# Patient Record
Sex: Male | Born: 1975 | Race: Black or African American | Hispanic: No | Marital: Married | Smoking: Never smoker
Health system: Southern US, Community
[De-identification: ages and names within clinical notes are randomized; demographics above are authoritative.]

## PROBLEM LIST (undated history)

## (undated) DIAGNOSIS — F32A Depression, unspecified: Secondary | ICD-10-CM

## (undated) DIAGNOSIS — I10 Essential (primary) hypertension: Secondary | ICD-10-CM

## (undated) DIAGNOSIS — F329 Major depressive disorder, single episode, unspecified: Secondary | ICD-10-CM

## (undated) HISTORY — PX: KNEE ARTHROSCOPY: SHX127

## (undated) HISTORY — PX: CHOLECYSTECTOMY: SHX55

## (undated) HISTORY — PX: OTHER SURGICAL HISTORY: SHX169

## (undated) HISTORY — PX: CORNEAL TRANSPLANT: SHX108

---
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
## 2014-05-10 ENCOUNTER — Emergency Department
Admit: 2014-05-10 | Disposition: A | Discharge status: Home / Self Care | Payer: Self-pay | Admitting: Emergency Medicine

## 2014-05-10 LAB — CBC WITH DIFFERENTIAL/PLATELET
BASOS ABS: 0 10*3/uL (ref 0.0–0.1)
BASOS PCT: 0.5 %
Eosinophil #: 0.1 10*3/uL (ref 0.0–0.7)
Eosinophil %: 1.3 %
HCT: 38.7 % — ABNORMAL LOW (ref 40.0–52.0)
HGB: 13 g/dL (ref 13.0–18.0)
Lymphocyte #: 2.9 10*3/uL (ref 1.0–3.6)
Lymphocyte %: 51.4 %
MCH: 29.5 pg (ref 26.0–34.0)
MCHC: 33.5 g/dL (ref 32.0–36.0)
MCV: 88 fL (ref 80–100)
Monocyte #: 0.5 x10 3/mm (ref 0.2–1.0)
Monocyte %: 8.6 %
NEUTROS PCT: 38.2 %
Neutrophil #: 2.1 10*3/uL (ref 1.4–6.5)
Platelet: 340 10*3/uL (ref 150–440)
RBC: 4.39 10*6/uL — ABNORMAL LOW (ref 4.40–5.90)
RDW: 13.9 % (ref 11.5–14.5)
WBC: 5.5 10*3/uL (ref 3.8–10.6)

## 2014-05-10 LAB — BASIC METABOLIC PANEL
ANION GAP: 6 — AB (ref 7–16)
BUN: 16 mg/dL
CALCIUM: 8.9 mg/dL
CHLORIDE: 108 mmol/L
Co2: 28 mmol/L
Creatinine: 1.29 mg/dL — ABNORMAL HIGH
EGFR (African American): >60
EGFR (Non-African Amer.): >60
Glucose: 102 mg/dL — ABNORMAL HIGH
POTASSIUM: 3.9 mmol/L
Sodium: 142 mmol/L

## 2014-05-10 LAB — URINALYSIS, COMPLETE
BACTERIA: NONE SEEN
Bilirubin,UR: NEGATIVE
Blood: NEGATIVE
GLUCOSE, UR: NEGATIVE mg/dL (ref 0–75)
Ketone: NEGATIVE
Leukocyte Esterase: NEGATIVE
NITRITE: NEGATIVE
Ph: 7 (ref 4.5–8.0)
Protein: NEGATIVE
SPECIFIC GRAVITY: 1.012 (ref 1.003–1.030)
Squamous Epithelial: NONE SEEN

## 2014-05-10 LAB — TROPONIN I: Troponin-I: <0.03 ng/mL

## 2015-04-15 ENCOUNTER — Encounter: Payer: Self-pay | Admitting: *Deleted

## 2015-04-15 ENCOUNTER — Ambulatory Visit
Admission: EM | Admit: 2015-04-15 | Discharge: 2015-04-15 | Disposition: A | Discharge status: Home / Self Care | Payer: 59 | Attending: Family Medicine | Admitting: Family Medicine

## 2015-04-15 DIAGNOSIS — S161XXA Strain of muscle, fascia and tendon at neck level, initial encounter: Secondary | ICD-10-CM

## 2015-04-15 MED ORDER — NAPROXEN 500 MG PO TABS: 500.0000 mg | ORAL_TABLET | Freq: Two times a day (BID) | ORAL | Status: DC

## 2015-04-15 MED ORDER — METAXALONE 800 MG PO TABS: 800.0000 mg | ORAL_TABLET | Freq: Three times a day (TID) | ORAL | Status: DC
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
## 2015-04-15 NOTE — Discharge Instructions (Signed)
Cervical Sprain  A cervical sprain is an injury in the neck in which the strong, fibrous tissues (ligaments) that connect your neck bones stretch or tear. Cervical sprains can range from mild to severe. Severe cervical sprains can cause the neck vertebrae to be unstable. This can lead to damage of the spinal cord and can result in serious nervous system problems. The amount of time it takes for a cervical sprain to get better depends on the cause and extent of the injury. Most cervical sprains heal in 1 to 3 weeks.  CAUSES   Severe cervical sprains may be caused by:    Contact sport injuries (such as from football, rugby, wrestling, hockey, auto racing, gymnastics, diving, martial arts, or boxing).    Motor vehicle collisions.    Whiplash injuries. This is an injury from a sudden forward and backward whipping movement of the head and neck.   Falls.   Mild cervical sprains may be caused by:    Being in an awkward position, such as while cradling a telephone between your ear and shoulder.    Sitting in a chair that does not offer proper support.    Working at a poorly designed computer station.    Looking up or down for long periods of time.   SYMPTOMS    Pain, soreness, stiffness, or a burning sensation in the front, back, or sides of the neck. This discomfort may develop immediately after the injury or slowly, 24 hours or more after the injury.    Pain or tenderness directly in the middle of the back of the neck.    Shoulder or upper back pain.    Limited ability to move the neck.    Headache.    Dizziness.    Weakness, numbness, or tingling in the hands or arms.    Muscle spasms.    Difficulty swallowing or chewing.    Tenderness and swelling of the neck.   DIAGNOSIS   Most of the time your health care provider can diagnose a cervical sprain by taking your history and doing a physical exam. Your health care provider will ask about previous neck injuries and any known neck  problems, such as arthritis in the neck. X-rays may be taken to find out if there are any other problems, such as with the bones of the neck. Other tests, such as a CT scan or MRI, may also be needed.   TREATMENT   Treatment depends on the severity of the cervical sprain. Mild sprains can be treated with rest, keeping the neck in place (immobilization), and pain medicines. Severe cervical sprains are immediately immobilized. Further treatment is done to help with pain, muscle spasms, and other symptoms and may include:   Medicines, such as pain relievers, numbing medicines, or muscle relaxants.    Physical therapy. This may involve stretching exercises, strengthening exercises, and posture training. Exercises and improved posture can help stabilize the neck, strengthen muscles, and help stop symptoms from returning.   HOME CARE INSTRUCTIONS    Put ice on the injured area.     Put ice in a plastic bag.     Place a towel between your skin and the bag.     Leave the ice on for 15-20 minutes, 3-4 times a day.    If your injury was severe, you may have been given a cervical collar to wear. A cervical collar is a two-piece collar designed to keep your neck from moving while it heals.      Do not remove the collar unless instructed by your health care provider.    If you have long hair, keep it outside of the collar.    Ask your health care provider before making any adjustments to your collar. Minor adjustments may be required over time to improve comfort and reduce pressure on your chin or on the back of your head.    Ifyou are allowed to remove the collar for cleaning or bathing, follow your health care provider's instructions on how to do so safely.    Keep your collar clean by wiping it with mild soap and water and drying it completely. If the collar you have been given includes removable pads, remove them every 1-2 days and hand wash them with soap and water. Allow them to air dry. They should be completely  dry before you wear them in the collar.    If you are allowed to remove the collar for cleaning and bathing, wash and dry the skin of your neck. Check your skin for irritation or sores. If you see any, tell your health care provider.    Do not drive while wearing the collar.    Only take over-the-counter or prescription medicines for pain, discomfort, or fever as directed by your health care provider.    Keep all follow-up appointments as directed by your health care provider.    Keep all physical therapy appointments as directed by your health care provider.    Make any needed adjustments to your workstation to promote good posture.    Avoid positions and activities that make your symptoms worse.    Warm up and stretch before being active to help prevent problems.   SEEK MEDICAL CARE IF:    Your pain is not controlled with medicine.    You are unable to decrease your pain medicine over time as planned.    Your activity level is not improving as expected.   SEEK IMMEDIATE MEDICAL CARE IF:    You develop any bleeding.   You develop stomach upset.   You have signs of an allergic reaction to your medicine.    Your symptoms get worse.    You develop new, unexplained symptoms.    You have numbness, tingling, weakness, or paralysis in any part of your body.   MAKE SURE YOU:    Understand these instructions.   Will watch your condition.   Will get help right away if you are not doing well or get worse.     This information is not intended to replace advice given to you by your health care provider. Make sure you discuss any questions you have with your health care provider.     Document Released: 11/01/2006 Document Revised: 01/09/2013 Document Reviewed: 07/12/2012  Elsevier Interactive Patient Education 2016 [employer]
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
## 2016-05-26 ENCOUNTER — Other Ambulatory Visit: Payer: Self-pay | Admitting: Orthopedic Surgery

## 2016-05-26 DIAGNOSIS — M25561 Pain in right knee: Secondary | ICD-10-CM

## 2016-05-26 DIAGNOSIS — M2391 Unspecified internal derangement of right knee: Secondary | ICD-10-CM

## 2016-05-26 DIAGNOSIS — M2351 Chronic instability of knee, right knee: Secondary | ICD-10-CM

## 2016-06-01 ENCOUNTER — Ambulatory Visit
Admission: RE | Admit: 2016-06-01 | Discharge: 2016-06-01 | Disposition: A | Discharge status: Home / Self Care | Payer: 59 | Source: Ambulatory Visit | Attending: Orthopedic Surgery | Admitting: Orthopedic Surgery

## 2016-06-01 DIAGNOSIS — M25561 Pain in right knee: Secondary | ICD-10-CM | POA: Diagnosis present

## 2016-06-01 DIAGNOSIS — S80221A Blister (nonthermal), right knee, initial encounter: Secondary | ICD-10-CM | POA: Diagnosis not present

## 2016-06-01 DIAGNOSIS — M2351 Chronic instability of knee, right knee: Secondary | ICD-10-CM

## 2016-06-01 DIAGNOSIS — R609 Edema, unspecified: Secondary | ICD-10-CM | POA: Diagnosis not present

## 2016-06-01 DIAGNOSIS — M2391 Unspecified internal derangement of right knee: Secondary | ICD-10-CM

## 2016-06-01 DIAGNOSIS — X58XXXA Exposure to other specified factors, initial encounter: Secondary | ICD-10-CM | POA: Diagnosis not present

## 2016-06-15 DIAGNOSIS — E669 Obesity, unspecified: Secondary | ICD-10-CM | POA: Insufficient documentation

## 2016-11-02 ENCOUNTER — Ambulatory Visit (INDEPENDENT_AMBULATORY_CARE_PROVIDER_SITE_OTHER): Payer: 59

## 2016-11-02 ENCOUNTER — Ambulatory Visit (INDEPENDENT_AMBULATORY_CARE_PROVIDER_SITE_OTHER): Payer: 59 | Admitting: Podiatry

## 2016-11-02 DIAGNOSIS — I10 Essential (primary) hypertension: Secondary | ICD-10-CM | POA: Insufficient documentation

## 2016-11-02 DIAGNOSIS — F32A Depression, unspecified: Secondary | ICD-10-CM | POA: Insufficient documentation

## 2016-11-02 DIAGNOSIS — F329 Major depressive disorder, single episode, unspecified: Secondary | ICD-10-CM | POA: Insufficient documentation

## 2016-11-02 DIAGNOSIS — M722 Plantar fascial fibromatosis: Secondary | ICD-10-CM

## 2016-11-04 NOTE — Progress Notes (Signed)
   Subjective: Patient presents today for intermittent pain and tenderness in the bilateral plantar heels that has been ongoing for the past 4-5 years. Patient states that it hurts in the morning with the first steps out of bed. He states working also increases the pain. He has been seen at this clinic in the past and was diagnosed with plantar fascitis. He has received injections in the past that have provided relief. He is also requesting orthotics. Patient presents today for further treatment and evaluation.  No past medical history on file.   Objective: Physical Exam General: The patient is alert and oriented x3 in no acute distress.  Dermatology: Skin is warm, dry and supple bilateral lower extremities. Negative for open lesions or macerations bilateral.   Vascular: Dorsalis Pedis and Posterior Tibial pulses palpable bilateral.  Capillary fill time is immediate to all digits.  Neurological: Epicritic and protective threshold intact bilateral.   Musculoskeletal: Tenderness to palpation at the medial calcaneal tubercale and through the insertion of the plantar fascia of the bilateral feet. All other joints range of motion within normal limits bilateral. Strength 5/5 in all groups bilateral.   Radiographic exam: Normal osseous mineralization. Joint spaces preserved. No fracture/dislocation/boney destruction. Calcaneal spur present with mild thickening of plantar fascia bilateral. No other soft tissue abnormalities or radiopaque foreign bodies.   Assessment: 1. plantar fasciitis bilateral feet  Plan of Care:  1. Patient evaluated. Xrays reviewed.   2. Injection of 0.5cc Celestone soluspan injected into the bilateral heels.  3. Prescription for Duexis given to patient. 4. Appointment with [facility]ick for custom molded orthotics. 5. Return to clinic when necessary.    Felecia ShellingBrent M. Alexi Dorminey, DPM [facility]  Dr. Felecia ShellingBrent M. Magic Mohler, DPM    [address]                                    [place], [place] [address]                Office [phone]  Fax [phone]) [phone]

## 2016-11-11 MED ORDER — BETAMETHASONE SOD PHOS & ACET 6 (3-3) MG/ML IJ SUSP: 3.0000 mg | Freq: Once | INTRAMUSCULAR | Status: DC

## 2016-11-24 ENCOUNTER — Ambulatory Visit (INDEPENDENT_AMBULATORY_CARE_PROVIDER_SITE_OTHER): Payer: 59 | Admitting: Orthotics

## 2016-11-24 DIAGNOSIS — M722 Plantar fascial fibromatosis: Secondary | ICD-10-CM

## 2016-11-24 NOTE — Progress Notes (Signed)

## 2016-12-03 ENCOUNTER — Encounter: Payer: Self-pay | Admitting: Podiatry

## 2016-12-03 ENCOUNTER — Ambulatory Visit (INDEPENDENT_AMBULATORY_CARE_PROVIDER_SITE_OTHER): Payer: 59 | Admitting: Podiatry

## 2016-12-03 DIAGNOSIS — M722 Plantar fascial fibromatosis: Secondary | ICD-10-CM | POA: Diagnosis not present

## 2016-12-03 MED ORDER — MELOXICAM 15 MG PO TABS: 15.0000 mg | ORAL_TABLET | Freq: Every day | ORAL | 1 refills | Status: DC

## 2016-12-03 MED ORDER — METHYLPREDNISOLONE 4 MG PO TABS: 4.0000 mg | ORAL_TABLET | Freq: Every day | ORAL | 0 refills | Status: DC

## 2016-12-06 NOTE — Progress Notes (Signed)
   Subjective: Patient presents today for follow up evaluation of bilateral plantar fasciitis. He states the right heel is worse than the left but reports significant pain in both. He rates the pain at 10/10. He reports some relief of the symptoms after receiving the injection. There are no modifying factors noted. Patient presents today for further treatment and evaluation.   No past medical history on file.   Objective: Physical Exam General: The patient is alert and oriented x3 in no acute distress.  Dermatology: Skin is warm, dry and supple bilateral lower extremities. Negative for open lesions or macerations bilateral.   Vascular: Dorsalis Pedis and Posterior Tibial pulses palpable bilateral.  Capillary fill time is immediate to all digits.  Neurological: Epicritic and protective threshold intact bilateral.   Musculoskeletal: Tenderness to palpation at the medial calcaneal tubercale and through the insertion of the plantar fascia of the bilateral feet. All other joints range of motion within normal limits bilateral. Strength 5/5 in all groups bilateral.   Assessment: 1. plantar fasciitis bilateral feet  Plan of Care:  1. Patient evaluated.  2. Injection of 0.5cc Celestone soluspan injected into the bilateral heels.  3. Prescription for Medrol Dose Pak ordered for patient. 4. Prescription for Meloxicam ordered for patient. Discontinue taking Duexis due to stomach intolerance.  5. Scheduled to pick up orthotics in 3 weeks. 6. Return to clinic when necessary.    Felecia ShellingBrent M. Wiatt Mahabir, DPM [facility]  Dr. Felecia ShellingBrent M. Dalynn Jhaveri, DPM    [address]                                   [place], [place] [address]                Office [phone]  Fax [phone]

## 2016-12-21 ENCOUNTER — Ambulatory Visit: Payer: 59 | Admitting: Orthotics

## 2016-12-21 DIAGNOSIS — M722 Plantar fascial fibromatosis: Secondary | ICD-10-CM

## 2016-12-21 NOTE — Progress Notes (Signed)
Patient came in today to pick up custom made foot orthotics.  The goals were accomplished and the patient reported no dissatisfaction with said orthotics.  Patient was advised of breakin period and how to report any issues. 

## 2017-02-10 ENCOUNTER — Other Ambulatory Visit: Payer: Self-pay

## 2017-02-10 MED ORDER — MELOXICAM 15 MG PO TABS: 15.0000 mg | ORAL_TABLET | Freq: Every day | ORAL | 1 refills | Status: DC

## 2017-02-10 NOTE — Telephone Encounter (Signed)
Pharmacy request 90 day refill for Meloxicam.  Per Dr. Logan BoresEvans, ok to refill.  Script has been sent to pharmacy

## 2017-03-06 ENCOUNTER — Emergency Department
Admission: EM | Admit: 2017-03-06 | Discharge: 2017-03-06 | Disposition: A | Discharge status: Home / Self Care | Payer: 59 | Attending: Emergency Medicine | Admitting: Emergency Medicine

## 2017-03-06 ENCOUNTER — Other Ambulatory Visit: Payer: Self-pay

## 2017-03-06 ENCOUNTER — Encounter: Payer: Self-pay | Admitting: Emergency Medicine

## 2017-03-06 DIAGNOSIS — Z79899 Other long term (current) drug therapy: Secondary | ICD-10-CM | POA: Diagnosis not present

## 2017-03-06 DIAGNOSIS — I10 Essential (primary) hypertension: Secondary | ICD-10-CM | POA: Diagnosis not present

## 2017-03-06 DIAGNOSIS — R05 Cough: Secondary | ICD-10-CM | POA: Diagnosis present

## 2017-03-06 DIAGNOSIS — J069 Acute upper respiratory infection, unspecified: Secondary | ICD-10-CM | POA: Insufficient documentation

## 2017-03-06 DIAGNOSIS — B9789 Other viral agents as the cause of diseases classified elsewhere: Secondary | ICD-10-CM | POA: Diagnosis not present

## 2017-03-06 HISTORY — DX: Depression, unspecified: F32.A

## 2017-03-06 HISTORY — DX: Essential (primary) hypertension: I10

## 2017-03-06 HISTORY — DX: Major depressive disorder, single episode, unspecified: F32.9

## 2017-03-06 LAB — INFLUENZA PANEL BY PCR (TYPE A & B)
Influenza A By PCR: NEGATIVE
Influenza B By PCR: NEGATIVE

## 2017-03-06 LAB — GROUP A STREP BY PCR: Group A Strep by PCR: NOT DETECTED

## 2017-03-06 MED ORDER — GUAIFENESIN-CODEINE 100-10 MG/5ML PO SOLN: 5.0000 mL | Freq: Four times a day (QID) | ORAL | 0 refills | Status: DC | PRN

## 2017-03-06 NOTE — ED Notes (Signed)
See triage note  Presents with left ear pain,cough and headache which started on Friday  Denies any fever afebrile on arrival   Cough is non prod

## 2017-03-06 NOTE — ED Provider Notes (Signed)
[facility] Emergency Department Provider Note  ____________________________________________   First MD Initiated Contact with Patient 03/06/17 0720     (approximate)  I have reviewed the triage vital signs and the nursing notes.   HISTORY  Chief Complaint Cough; Headache; Otalgia; and Sore Throat   HPI Darren Perry is a 42 y.o. male is here with complaint of cough, sore throat, left ear pain for the last 2 days.  Patient also states he woke this morning with a headache.  He denies any nausea, vomiting or diarrhea.  Patient states he has tried some over-the-counter medication without any improvement.  He rates his pain is not over 10.   Past Medical History:  Diagnosis Date  . Depression   . Hypertension     Patient Active Problem List   Diagnosis Date Noted  . Depression 11/02/2016  . Hypertension 11/02/2016  . Obesity (BMI 30.0-34.9) 06/15/2016    Past Surgical History:  Procedure Laterality Date  . CORNEAL TRANSPLANT    . KNEE ARTHROSCOPY      Prior to Admission medications   Medication Sig Start Date End Date Taking? Authorizing Provider  hydrochlorothiazide (HYDRODIURIL) 25 MG tablet Take by mouth. 01/07/16 03/06/17 Yes [provider]  sertraline (ZOLOFT) 50 MG tablet Take 50 mg by mouth daily.   Yes [provider]  guaiFENesin-codeine 100-10 MG/5ML syrup Take 5 mLs by mouth every 6 (six) hours as needed for cough. 03/06/17   Tommi RumpsSummers, Marcy Bogosian L, PA-C    Allergies Patient has no known allergies.  Family History  Problem Relation Age of Onset  . Hypertension Mother   . Diabetes Mother   . Cancer Mother   . Hypertension Father     Social History Social History   Tobacco Use  . Smoking status: Never Smoker  . Smokeless tobacco: Never Used  Substance Use Topics  . Alcohol use: No  . Drug use: No    Review of Systems Constitutional: No fever/chills Eyes: No visual changes. ENT: Positive for sore throat  and left ear pain. Cardiovascular: Denies chest pain. Respiratory: Denies shortness of breath.  Positive for cough. Gastrointestinal: No abdominal pain.  No nausea, no vomiting.  No diarrhea.  Musculoskeletal: Positive for body aches. Skin: Negative for rash. Neurological: Positive for headaches, no focal weakness or numbness. ____________________________________________   PHYSICAL EXAM:  VITAL SIGNS: ED Triage Vitals  Enc Vitals Group     BP 03/06/17 0527 (!) 138/94     Pulse Rate 03/06/17 0527 84     Resp 03/06/17 0527 17     Temp 03/06/17 0527 97.7 F (36.5 C)     Temp Source 03/06/17 0527 Oral     SpO2 03/06/17 0527 100 %     Weight 03/06/17 0528 220 lb (99.8 kg)     Height 03/06/17 0528 5\' 8"  (1.727 m)     Head Circumference --      Peak Flow --      Pain Score 03/06/17 0527 9     Pain Loc --      Pain Edu? --      Excl. in GC? --    Constitutional: Alert and oriented. Well appearing and in no acute distress. Eyes: Conjunctivae are normal.  Head: Atraumatic. Nose: Mild congestion/rhinnorhea.  TMs are dull bilaterally without erythema or injection. Mouth/Throat: Mucous membranes are moist.  Oropharynx non-erythematous.  Posterior drainage noted. Neck: No stridor.   Hematological/Lymphatic/Immunilogical: No cervical lymphadenopathy. Cardiovascular: Normal rate, regular rhythm. Grossly normal  heart sounds.  Good peripheral circulation. Respiratory: Normal respiratory effort.  No retractions. Lungs CTAB. Gastrointestinal: Soft and nontender. No distention.  Musculoskeletal: Moves upper and lower extremities without any difficulty.  Normal gait was noted. Neurologic:  Normal speech and language. No gross focal neurologic deficits are appreciated.  Skin:  Skin is warm, dry and intact. No rash noted. Psychiatric: Mood and affect are normal. Speech and behavior are normal.  ____________________________________________   LABS (all labs ordered are listed, but only  abnormal results are displayed)  Labs Reviewed  GROUP A STREP BY PCR  INFLUENZA PANEL BY PCR (TYPE A & B)    PROCEDURES  Procedure(s) performed: None  Procedures  Critical Care performed: No  ____________________________________________   INITIAL IMPRESSION / ASSESSMENT AND PLAN / ED COURSE Patient was given prescription for Robitussin-AC to take as needed for cough and congestion.  She is to continue taking Tylenol or ibuprofen if needed for fever or body aches.  He is encouraged to increase fluids.  And follow-up with [facility] primary if any continued problems.  ____________________________________________   FINAL CLINICAL IMPRESSION(S) / ED DIAGNOSES  Final diagnoses:  Viral URI with cough     ED Discharge Orders        Ordered    guaiFENesin-codeine 100-10 MG/5ML syrup  Every 6 hours PRN     03/06/17 0727       Note:  This document was prepared using Dragon voice recognition software and may include unintentional dictation errors.    Tommi Rumps, PA-C 03/06/17 1401    Arnaldo Natal, MD 03/06/17 [phone]

## 2017-03-06 NOTE — ED Triage Notes (Signed)
Pt reports cough, sore throat and left earache since Friday; woke this am with headache to top right side of his head; headache has eased some since taking Alleve;

## 2017-03-06 NOTE — Discharge Instructions (Signed)
Follow-up with your primary care provider if any continued problems.  Increase fluids.  Tylenol or ibuprofen as needed for headache or body aches.  Robitussin-AC contains codeine which is a narcotic.  Do not take this medication and drive or operate machinery.

## 2017-04-12 ENCOUNTER — Ambulatory Visit (INDEPENDENT_AMBULATORY_CARE_PROVIDER_SITE_OTHER): Payer: 59 | Admitting: Podiatry

## 2017-04-12 ENCOUNTER — Encounter: Payer: Self-pay | Admitting: Podiatry

## 2017-04-12 ENCOUNTER — Other Ambulatory Visit: Payer: Self-pay | Admitting: Podiatry

## 2017-04-12 ENCOUNTER — Ambulatory Visit (INDEPENDENT_AMBULATORY_CARE_PROVIDER_SITE_OTHER): Payer: 59

## 2017-04-12 DIAGNOSIS — M659 Synovitis and tenosynovitis, unspecified: Secondary | ICD-10-CM

## 2017-04-12 DIAGNOSIS — M722 Plantar fascial fibromatosis: Secondary | ICD-10-CM

## 2017-04-12 DIAGNOSIS — M779 Enthesopathy, unspecified: Secondary | ICD-10-CM

## 2017-04-12 MED ORDER — MELOXICAM 15 MG PO TABS: 15.0000 mg | ORAL_TABLET | Freq: Every day | ORAL | 3 refills | Status: DC

## 2017-04-14 NOTE — Progress Notes (Signed)
   Subjective: 42 year old male presents today for follow up evaluation of bilateral plantar fasciitis that began a few weeks ago. He states that injections and wearing his orthotics have helped alleviate the pain in the past. Walking and bearing weight for long periods of time increases the pain.  He also reports pain to the medial aspect of the left ankle that began two weeks ago. He has not done anything to treat the pain. Walking and bearing weight increases this pain as well. He denies any injury. Patient is here for further evaluation and treatment.   Past Medical History:  Diagnosis Date  . Depression   . Hypertension      Objective: Physical Exam General: The patient is alert and oriented x3 in no acute distress.  Dermatology: Skin is warm, dry and supple bilateral lower extremities. Negative for open lesions or macerations bilateral.   Vascular: Dorsalis Pedis and Posterior Tibial pulses palpable bilateral.  Capillary fill time is immediate to all digits.  Neurological: Epicritic and protective threshold intact bilateral.   Musculoskeletal: Tenderness to palpation to the plantar aspect of the bilateral heels along the plantar fascia. Pain with palpation noted to the medial aspect of the patient's left ankle. All other joints range of motion within normal limits bilateral. Strength 5/5 in all groups bilateral.   Radiographic exam: Normal osseous mineralization. Joint spaces preserved. No fracture/dislocation/boney destruction. No other soft tissue abnormalities or radiopaque foreign bodies.   Assessment: 1. plantar fasciitis bilateral feet 2. Synovitis left ankle  Plan of Care:  1. Patient evaluated. Xrays reviewed.   2. Injection of 0.5cc Celestone soluspan injected into the bilateral heels.  3. Injection of 0.5 mLs Celestone Soluspan injected into the left ankle joint.  4. Refill prescription for Mobic 15 mg provided to patient.  5. Ankle brace dispensed.  6. Return to  clinic in 4 weeks.    Felecia ShellingBrent M. Evans, DPM [facility]  Dr. Felecia ShellingBrent M. Evans, DPM    [address]                                   [place], [place] [address]                Office [phone]  Fax [phone]) [phone]

## 2017-09-13 ENCOUNTER — Encounter: Payer: Self-pay | Admitting: Podiatry

## 2017-09-13 ENCOUNTER — Ambulatory Visit (INDEPENDENT_AMBULATORY_CARE_PROVIDER_SITE_OTHER): Payer: 59 | Admitting: Podiatry

## 2017-09-13 DIAGNOSIS — M76822 Posterior tibial tendinitis, left leg: Secondary | ICD-10-CM | POA: Diagnosis not present

## 2017-09-13 DIAGNOSIS — M722 Plantar fascial fibromatosis: Secondary | ICD-10-CM

## 2017-09-14 NOTE — Progress Notes (Signed)
    HPI: 42 year old male presenting today for follow up evaluation of bilateral ankle and heel pain that flared up 2-3 weeks ago. He states the left is worse than the right. Walking and standing increases the pain. He has been taking Ibuprofen, Naproxen and using an OTC pain cream which provided some relief. Patient is here for further evaluation and treatment.   Past Medical History:  Diagnosis Date  . Depression   . Hypertension        Physical Exam: General: The patient is alert and oriented x3 in no acute distress.  Dermatology: Skin is warm, dry and supple bilateral lower extremities. Negative for open lesions or macerations.  Vascular: Palpable pedal pulses bilaterally. No edema or erythema noted. Capillary refill within normal limits.  Neurological: Epicritic and protective threshold grossly intact bilaterally.   Musculoskeletal Exam: Pain on palpation noted to the posterior tibial tendon of the left foot. Tenderness to palpation to the plantar aspect of the bilateral heels along the plantar fascia. Range of motion within normal limits. Muscle strength 5/5 in all muscle groups bilateral lower extremities.  Assessment: 1. Posterior tibial tendinitis left 2. Plantar fasciitis bilateral    Plan of Care:  1. Patient was evaluated. 2. Injection of 0.5 mL Celestone Soluspan injected into the posterior tibial tendon sheath.  3. Injection of 0.5 mLs Celestone Soluspan injected into the bilateral heels.  4. Continue taking Naproxen 500 mg BID prescribed by PCP.  5. Discussed surgery as a future option to alleviate chronic plantar fasciitis.  6. Return to clinic as needed for surgical consult.   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    Felecia ShellingBrent M. Revonda Menter, DPM [facility]  Dr. Felecia ShellingBrent M. Elric Tirado, DPM    [address]                                        [place], [place] [address]                Office [phone]  Fax [phone]  [phone]

## 2017-12-06 ENCOUNTER — Ambulatory Visit (INDEPENDENT_AMBULATORY_CARE_PROVIDER_SITE_OTHER): Payer: 59 | Admitting: Podiatry

## 2017-12-06 ENCOUNTER — Encounter: Payer: Self-pay | Admitting: Podiatry

## 2017-12-06 DIAGNOSIS — M76822 Posterior tibial tendinitis, left leg: Secondary | ICD-10-CM

## 2017-12-08 NOTE — Progress Notes (Signed)
    HPI: 42 year old male presenting today for follow up evaluation of bilateral plantar fasciitis and left PT tendinitis. He states the left ankle is hurting more than the plantar fasciitis. He reports pain that radiates up his LLE. He has been using the ankle brace when working which helps alleviate the pain. Patient is here for further evaluation and treatment.   Past Medical History:  Diagnosis Date  . Depression   . Hypertension        Physical Exam: General: The patient is alert and oriented x3 in no acute distress.  Dermatology: Skin is warm, dry and supple bilateral lower extremities. Negative for open lesions or macerations.  Vascular: Palpable pedal pulses bilaterally. No edema or erythema noted. Capillary refill within normal limits.  Neurological: Epicritic and protective threshold grossly intact bilaterally.   Musculoskeletal Exam: Pain on palpation noted to the posterior tibial tendon of the left foot. Tenderness to palpation to the plantar aspect of the bilateral heels along the plantar fascia. Range of motion within normal limits. Muscle strength 5/5 in all muscle groups bilateral lower extremities.  Assessment: 1. Posterior tibial tendinitis left 2. Plantar fasciitis bilateral - improved    Plan of Care:  1. Patient was evaluated. 2. Injection of 0.5 mL Celestone Soluspan injected into the left posterior tibial tendon sheath.  3. Continue using ankle brace.  4. Continue taking Naproxen 500 mg BID prescribed by PCP.  5. Return to clinic as needed.   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    Felecia ShellingBrent M. Draycen Leichter, DPM [facility]  Dr. Felecia ShellingBrent M. Nickolai Rinks, DPM    [address]                                        [place], [place] [address]                Office [phone]  Fax [phone]

## 2018-01-31 ENCOUNTER — Other Ambulatory Visit: Payer: Self-pay | Admitting: *Deleted

## 2018-01-31 DIAGNOSIS — R200 Anesthesia of skin: Secondary | ICD-10-CM

## 2018-02-07 ENCOUNTER — Ambulatory Visit: Payer: 59 | Admitting: Podiatry

## 2018-02-21 ENCOUNTER — Encounter: Payer: Self-pay | Admitting: Podiatry

## 2018-02-21 ENCOUNTER — Ambulatory Visit (INDEPENDENT_AMBULATORY_CARE_PROVIDER_SITE_OTHER): Payer: 59 | Admitting: Podiatry

## 2018-02-21 DIAGNOSIS — M722 Plantar fascial fibromatosis: Secondary | ICD-10-CM | POA: Diagnosis not present

## 2018-02-21 DIAGNOSIS — M76822 Posterior tibial tendinitis, left leg: Secondary | ICD-10-CM

## 2018-02-21 MED ORDER — METHYLPREDNISOLONE 4 MG PO TBPK: ORAL_TABLET | ORAL | 0 refills | Status: DC

## 2018-02-24 NOTE — Progress Notes (Signed)
    HPI: 43 year old male presenting today for follow up evaluation of bilateral plantar fasciitis and left PT tendinitis. He states the tendinitis of the left lower extremity is still bothersome. He states the pain is as bad as it was initially on some days. He has been taking Naproxen he received from his PCP. He has been using the custom orthotics and ankle braces. Patient is here for further evaluation and treatment.   Past Medical History:  Diagnosis Date  . Depression   . Hypertension        Physical Exam: General: The patient is alert and oriented x3 in no acute distress.  Dermatology: Skin is warm, dry and supple bilateral lower extremities. Negative for open lesions or macerations.  Vascular: Palpable pedal pulses bilaterally. No edema or erythema noted. Capillary refill within normal limits.  Neurological: Epicritic and protective threshold grossly intact bilaterally.   Musculoskeletal Exam: Pain on palpation noted to the posterior tibial tendon of the left foot. Tenderness to palpation to the plantar aspect of the bilateral heels along the plantar fascia. Range of motion within normal limits. Muscle strength 5/5 in all muscle groups bilateral lower extremities.  Assessment: 1. Posterior tibial tendinitis left 2. Plantar fasciitis bilateral    Plan of Care:  1. Patient was evaluated. 2. Injection of 0.5 mL Celestone Soluspan injected into the left posterior tibial tendon sheath.  3. Injection of 0.5 mLs Celestone Soluspan injected into the bilateral heels.  4. Continue using custom orthotics and ankle brace.  5. Prescription for Medrol Dose Pak provided to patient. Then continue taking Naproxen 500 mg BID prescribed by PCP.  6. Patient contemplating surgery in the fall of this year. We will need MRI of the left PT tendon prior to surgery.  7. Return to clinic as needed.   Works two jobs. One is at [employer].    Felecia Shelling, DPM [facility]  Dr. Felecia Shelling, DPM    [address]                                        [place], [place] [address]                Office [phone]  Fax [phone]

## 2018-05-02 ENCOUNTER — Ambulatory Visit: Payer: 59 | Admitting: Podiatry

## 2018-06-06 ENCOUNTER — Other Ambulatory Visit: Payer: Self-pay

## 2018-06-06 ENCOUNTER — Ambulatory Visit (INDEPENDENT_AMBULATORY_CARE_PROVIDER_SITE_OTHER): Payer: 59 | Admitting: Podiatry

## 2018-06-06 ENCOUNTER — Encounter: Payer: Self-pay | Admitting: Podiatry

## 2018-06-06 VITALS — Temp 98.4°F

## 2018-06-06 DIAGNOSIS — M722 Plantar fascial fibromatosis: Secondary | ICD-10-CM

## 2018-06-06 DIAGNOSIS — M76822 Posterior tibial tendinitis, left leg: Secondary | ICD-10-CM

## 2018-06-08 NOTE — Progress Notes (Signed)
    HPI: 43 year old male presenting today for follow up evaluation of bilateral plantar fasciitis and left PT tendinitis. He reports continued pain in the left ankle. He states it is constant. He has been taking Naproxen. Being on the feet for long periods of time increases the pain. Patient is here for further evaluation and treatment.   Past Medical History:  Diagnosis Date  . Depression   . Hypertension      Physical Exam: General: The patient is alert and oriented x3 in no acute distress.  Dermatology: Skin is warm, dry and supple bilateral lower extremities. Negative for open lesions or macerations.  Vascular: Palpable pedal pulses bilaterally. No edema or erythema noted. Capillary refill within normal limits.  Neurological: Epicritic and protective threshold grossly intact bilaterally.   Musculoskeletal Exam: Pain on palpation noted to the posterior tibial tendon of the left foot. Tenderness to palpation to the plantar aspect of the bilateral heels along the plantar fascia. Range of motion within normal limits. Muscle strength 5/5 in all muscle groups bilateral lower extremities.  Assessment: 1. Posterior tibial tendinitis left 2. Plantar fasciitis bilateral - improved   Plan of Care:  1. Patient was evaluated. 2. Injection of 0.5 mL Celestone Soluspan injected into the left posterior tibial tendon sheath.  3. Continue taking Naproxen.  4. MRI of the left ankle ordered.  5. Return to clinic in 4 weeks to review MRI.   Works two jobs. One is at [employer]. 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   Felecia Shelling, DPM [facility]  Dr. Felecia Shelling, DPM    [address]                                        [place], [place] [address]                Office [phone]  Fax [phone]

## 2018-06-27 ENCOUNTER — Telehealth: Payer: Self-pay

## 2018-06-27 DIAGNOSIS — M76822 Posterior tibial tendinitis, left leg: Secondary | ICD-10-CM

## 2018-06-27 NOTE — Telephone Encounter (Signed)
-----   Message from Edrick Kins, DPM sent at 06/06/2018  5:09 PM EDT ----- Regarding: MRI left ankle Please order MRI left ankle.   Dx: chronic PT tendinitis left  Dr. Amalia Hailey

## 2018-06-27 NOTE — Telephone Encounter (Signed)
MRI has been approved from 06/27/2018 to 08/11/2018   Auth # [id number]  Patient's wife has been notified and she will call to set up his appt to his convenience.

## 2018-07-11 ENCOUNTER — Ambulatory Visit: Payer: 59 | Admitting: Podiatry

## 2018-07-12 ENCOUNTER — Other Ambulatory Visit: Payer: Self-pay

## 2018-07-12 ENCOUNTER — Ambulatory Visit
Admission: RE | Admit: 2018-07-12 | Discharge: 2018-07-12 | Disposition: A | Discharge status: Home / Self Care | Payer: 59 | Source: Ambulatory Visit | Attending: Podiatry | Admitting: Podiatry

## 2018-07-12 DIAGNOSIS — M76822 Posterior tibial tendinitis, left leg: Secondary | ICD-10-CM | POA: Insufficient documentation

## 2018-07-25 ENCOUNTER — Ambulatory Visit (INDEPENDENT_AMBULATORY_CARE_PROVIDER_SITE_OTHER): Payer: 59 | Admitting: Podiatry

## 2018-07-25 ENCOUNTER — Encounter: Payer: Self-pay | Admitting: Podiatry

## 2018-07-25 ENCOUNTER — Other Ambulatory Visit: Payer: Self-pay

## 2018-07-25 DIAGNOSIS — M76822 Posterior tibial tendinitis, left leg: Secondary | ICD-10-CM

## 2018-07-25 DIAGNOSIS — M722 Plantar fascial fibromatosis: Secondary | ICD-10-CM | POA: Diagnosis not present

## 2018-07-27 NOTE — Progress Notes (Signed)
    HPI: 43 year old male presenting today for follow up evaluation of bilateral plantar fasciitis and left PT tendinitis. He states he is doing well and his pain has improved significantly. He has been taking Naproxen for pain and denies modifying factors. Patient is here for further evaluation and treatment.   Past Medical History:  Diagnosis Date  . Depression   . Hypertension      Physical Exam: General: The patient is alert and oriented x3 in no acute distress.  Dermatology: Skin is warm, dry and supple bilateral lower extremities. Negative for open lesions or macerations.  Vascular: Palpable pedal pulses bilaterally. No edema or erythema noted. Capillary refill within normal limits.  Neurological: Epicritic and protective threshold grossly intact bilaterally.   Musculoskeletal Exam: Pain on palpation noted to the posterior tibial tendon of the left foot. Tenderness to palpation to the plantar aspect of the bilateral heels along the plantar fascia. Range of motion within normal limits. Muscle strength 5/5 in all muscle groups bilateral lower extremities.  MRI Impression:  1. 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  Assessment: 1. Posterior tibial tendinitis with split tear left  2. Plantar fasciitis bilateral    Plan of Care:  1. Patient was evaluated. MRI reviewed.  2. Continue taking Naproxen as needed.  3. Recommended good shoe gear.  4. Return to clinic in September for surgical consult. Patient wants surgery in October-November.   Works two jobs. One is at [employer]. 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   Edrick Kins, DPM [facility]  Dr. Edrick Kins, [place]                                        [place], [place] [address]                Office [phone]  Fax [phone]

## 2018-09-19 ENCOUNTER — Encounter: Payer: Self-pay | Admitting: Podiatry

## 2018-09-19 ENCOUNTER — Ambulatory Visit (INDEPENDENT_AMBULATORY_CARE_PROVIDER_SITE_OTHER): Payer: 59 | Admitting: Podiatry

## 2018-09-19 ENCOUNTER — Other Ambulatory Visit: Payer: Self-pay

## 2018-09-19 DIAGNOSIS — M722 Plantar fascial fibromatosis: Secondary | ICD-10-CM

## 2018-09-19 DIAGNOSIS — M76822 Posterior tibial tendinitis, left leg: Secondary | ICD-10-CM | POA: Diagnosis not present

## 2018-09-19 NOTE — Patient Instructions (Signed)
Pre-Operative Instructions  Congratulations, you have decided to take an important step towards improving your quality of life.  You can be assured that the doctors and staff at [facility] will be with you every step of the way.  Here are some important things you should know:  1. Plan to be at the surgery center/hospital at least 1 (one) hour prior to your scheduled time, unless otherwise directed by the surgical center/hospital staff.  You must have a responsible adult accompany you, remain during the surgery and drive you home.  Make sure you have directions to the surgical center/hospital to ensure you arrive on time. 2. If you are having surgery at [facility] or [facility], you will need a copy of your medical history and physical form from your family physician within one month prior to the date of surgery. We will give you a form for your primary physician to complete.  3. We make every effort to accommodate the date you request for surgery.  However, there are times where surgery dates or times have to be moved.  We will contact you as soon as possible if a change in schedule is required.   4. No aspirin/ibuprofen for one week before surgery.  If you are on aspirin, any non-steroidal anti-inflammatory medications (Mobic, Aleve, Ibuprofen) should not be taken seven (7) days prior to your surgery.  You make take Tylenol for pain prior to surgery.  5. Medications - If you are taking daily heart and blood pressure medications, seizure, reflux, allergy, asthma, anxiety, pain or diabetes medications, make sure you notify the surgery center/hospital before the day of surgery so they can tell you which medications you should take or avoid the day of surgery. 6. No food or drink after midnight the night before surgery unless directed otherwise by surgical center/hospital staff. 7. No alcoholic beverages 24-hours prior to surgery.  No smoking 24-hours prior or 24-hours after  surgery. 8. Wear loose pants or shorts. They should be loose enough to fit over bandages, boots, and casts. 9. Don't wear slip-on shoes. Sneakers are preferred. 10. Bring your boot with you to the surgery center/hospital.  Also bring crutches or a walker if your physician has prescribed it for you.  If you do not have this equipment, it will be provided for you after surgery. 11. If you have not been contacted by the surgery center/hospital by the day before your surgery, call to confirm the date and time of your surgery. 12. Leave-time from work may vary depending on the type of surgery you have.  Appropriate arrangements should be made prior to surgery with your employer. 13. Prescriptions will be provided immediately following surgery by your doctor.  Fill these as soon as possible after surgery and take the medication as directed. Pain medications will not be refilled on weekends and must be approved by the doctor. 14. Remove nail polish on the operative foot and avoid getting pedicures prior to surgery. 15. Wash the night before surgery.  The night before surgery wash the foot and leg well with water and the antibacterial soap provided. Be sure to pay special attention to beneath the toenails and in between the toes.  Wash for at least three (3) minutes. Rinse thoroughly with water and dry well with a towel.  Perform this wash unless told not to do so by your physician.  Enclosed: 1 Ice pack (please put in freezer the night before surgery)   1 Hibiclens skin cleaner     Pre-op instructions  If you have any questions regarding the instructions, please do not hesitate to call our office.  [place]: [address], [place], [place] [address] -- [phone]  [place]: [address] [place], [place] [address] -- [phone]  [place]: [address]  [place], [place] [address] -- [phone]  [place]: [address], [place], [place] [address] -- [phone]  Website:  [email or web address] 

## 2018-09-20 ENCOUNTER — Telehealth: Payer: Self-pay | Admitting: *Deleted

## 2018-09-20 NOTE — Telephone Encounter (Addendum)
"  I'm calling to schedule surgery for my husband, Darren Perry."  Dr. Amalia Hailey does surgeries on Thursdays.  Dr. Amalia Hailey' next available date is not going to be until the end of October.  "We want to go out further than that and do it on December 3."  It is available, I'll get it scheduled.  Someone from the surgical center will give him a call a day or two prior to his surgery date and will give him his arrival time.  He needs to register online with the surgical center via their [facility].  The instructions are in the brochure that we gave him.

## 2018-09-21 NOTE — Progress Notes (Signed)
    HPI: 43 year old male presenting today for follow up evaluation of bilateral plantar fasciitis and left PT tendinitis of the left lower extremity. He states his pain has improved but has not resolved. He has been taking Naproxen for treatment. He denies any current worsening factors. He is still interested in surgical intervention. Patient is here for further evaluation and treatment.   Past Medical History:  Diagnosis Date  . Depression   . Hypertension      Physical Exam: General: The patient is alert and oriented x3 in no acute distress.  Dermatology: Skin is warm, dry and supple bilateral lower extremities. Negative for open lesions or macerations.  Vascular: Palpable pedal pulses bilaterally. No edema or erythema noted. Capillary refill within normal limits.  Neurological: Epicritic and protective threshold grossly intact bilaterally.   Musculoskeletal Exam: Pain on palpation noted to the posterior tibial tendon of the left foot. Tenderness to palpation to the plantar aspect of the bilateral heels along the plantar fascia. Range of motion within normal limits. Muscle strength 5/5 in all muscle groups bilateral lower extremities.  MRI Impression:  1. 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  Assessment: 1. Posterior tibial tendinitis with split tear left  2. Plantar fasciitis bilateral    Plan of Care:  1. Patient was evaluated. MRI reviewed.  2. Injection of 0.5 mLs Celestone Soluspan injected into the posterior tibial tendon sheath of the left lower extremity.  3. Injection of 0.5 mLs Celestone Soluspan injected into the bilateral heels along the plantar fascia.  4. Today we discussed the conservative versus surgical management of the presenting pathology. The patient opts for surgical management. All possible complications and details of the procedure were explained. All patient questions were answered. No guarantees were  expressed or implied. 5. Authorization for surgery was initiated today. Surgery will consist of EPF bilateral. Repair posterior tibial tendon left.  6. Return to clinic one week post op.    Works two jobs. One is at [employer]. 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   Edrick Kins, DPM [facility]  Dr. Edrick Kins, [place]                                        [place], [place] [address]                Office [phone]  Fax [phone]

## 2018-11-22 ENCOUNTER — Telehealth: Payer: Self-pay | Admitting: *Deleted

## 2018-11-22 NOTE — Telephone Encounter (Signed)
"  I'm calling to reschedule my husbands' surgery."  What date is he scheduled?  "He's scheduled for December 3."  What date do you want to reschedule it to?  "He wants to do it sometime in February."  Dr. Amalia Hailey can do it on February 22, 2019.  "That date is fine."  I'll get it rescheduled from 12/21/2018 to 02/22/2019.  I called and asked Caren Griffins at [facility] to reschedule his surgery from 12/21/2018 to 02/22/2019.

## 2019-01-02 ENCOUNTER — Other Ambulatory Visit: Payer: Self-pay | Admitting: Family Medicine

## 2019-01-02 DIAGNOSIS — N50819 Testicular pain, unspecified: Secondary | ICD-10-CM

## 2019-01-04 ENCOUNTER — Telehealth: Payer: Self-pay | Admitting: *Deleted

## 2019-01-08 ENCOUNTER — Encounter: Payer: Self-pay | Admitting: Emergency Medicine

## 2019-01-08 ENCOUNTER — Emergency Department: Payer: 59

## 2019-01-08 ENCOUNTER — Other Ambulatory Visit: Payer: Self-pay

## 2019-01-08 ENCOUNTER — Emergency Department
Admission: EM | Admit: 2019-01-08 | Discharge: 2019-01-08 | Disposition: A | Discharge status: Home / Self Care | Payer: 59 | Attending: Emergency Medicine | Admitting: Emergency Medicine

## 2019-01-08 DIAGNOSIS — Z79899 Other long term (current) drug therapy: Secondary | ICD-10-CM | POA: Diagnosis not present

## 2019-01-08 DIAGNOSIS — I10 Essential (primary) hypertension: Secondary | ICD-10-CM | POA: Insufficient documentation

## 2019-01-08 DIAGNOSIS — J069 Acute upper respiratory infection, unspecified: Secondary | ICD-10-CM

## 2019-01-08 DIAGNOSIS — U071 COVID-19: Secondary | ICD-10-CM | POA: Diagnosis not present

## 2019-01-08 DIAGNOSIS — R05 Cough: Secondary | ICD-10-CM | POA: Diagnosis present

## 2019-01-08 NOTE — ED Notes (Signed)
Computer in room not working. Pt given hardcopy of discharge papers to sign.

## 2019-01-08 NOTE — ED Triage Notes (Signed)
Patient ambulatory to triage with steady gait, without difficulty or distress noted, mask in place; pt reports since this weekend having nonprod cough and body aches with sore throat

## 2019-01-08 NOTE — ED Provider Notes (Signed)
Emergency Department Provider Note  ____________________________________________  Time seen: Approximately 10:39 PM  I have reviewed the triage vital signs and the nursing notes.   HISTORY  Chief Complaint Cough   Historian Patient     HPI Darren Perry is a 43 y.o. male presents to the emergency department with nonproductive cough, low-grade fever, malaise and diminished appetite.  He denies nausea, vomiting or diarrhea.  He denies known contact exposures to people with COVID-19.  He denies chest pain, chest tightness or abdominal pain.  No rash.  No recent travel.  No other alleviating measures of been attempted.    Past Medical History:  Diagnosis Date  . Depression   . Hypertension      Immunizations up to date:  Yes.     Past Medical History:  Diagnosis Date  . Depression   . Hypertension     Patient Active Problem List   Diagnosis Date Noted  . Depression 11/02/2016  . Hypertension 11/02/2016  . Obesity (BMI 30.0-34.9) 06/15/2016    Past Surgical History:  Procedure Laterality Date  . CORNEAL TRANSPLANT    . KNEE ARTHROSCOPY      Prior to Admission medications   Medication Sig Start Date End Date Taking? Authorizing Provider  guaiFENesin-codeine 100-10 MG/5ML syrup Take 5 mLs by mouth every 6 (six) hours as needed for cough. 03/06/17   Tommi Rumps, PA-C  hydrochlorothiazide (HYDRODIURIL) 25 MG tablet Take by mouth. 01/07/16 03/06/17  [provider]  meloxicam (MOBIC) 15 MG tablet Take 1 tablet (15 mg total) by mouth daily. 04/12/17   Felecia Shelling, DPM  methylPREDNISolone (MEDROL DOSEPAK) 4 MG TBPK tablet 6 day dose pack - take as directed 02/21/18   Felecia Shelling, DPM  sertraline (ZOLOFT) 50 MG tablet Take 50 mg by mouth daily.    [provider]    Allergies Patient has no known allergies.  Family History  Problem Relation Age of Onset  . Hypertension Mother   . Diabetes Mother   . Cancer Mother   . Hypertension  Father     Social History Social History   Tobacco Use  . Smoking status: Never Smoker  . Smokeless tobacco: Never Used  Substance Use Topics  . Alcohol use: No  . Drug use: No     Review of Systems  Constitutional: Patient has fever.  Eyes: No visual changes. No discharge ENT: Patient has congestion.  Cardiovascular: no chest pain. Respiratory: Patient has cough.  Gastrointestinal: No abdominal pain.  No nausea, no vomiting. Patient had diarrhea.  Genitourinary: Negative for dysuria. No hematuria Musculoskeletal: Patient has myalgias.  Skin: Negative for rash, abrasions, lacerations, ecchymosis. Neurological: Patient has headache, no focal weakness or numbness.     ____________________________________________   PHYSICAL EXAM:  VITAL SIGNS: ED Triage Vitals  Enc Vitals Group     BP 01/08/19 2008 133/90     Pulse Rate 01/08/19 2008 98     Resp 01/08/19 2008 18     Temp 01/08/19 2008 98.9 F (37.2 C)     Temp Source 01/08/19 2008 Oral     SpO2 01/08/19 2008 98 %     Weight 01/08/19 2009 206 lb (93.4 kg)     Height 01/08/19 2009 5\' 9"  (1.753 m)     Head Circumference --      Peak Flow --      Pain Score 01/08/19 2009 9     Pain Loc --      Pain Edu? --  Excl. in [facility]? --     Constitutional: Alert and oriented. Patient is lying supine. Eyes: Conjunctivae are normal. PERRL. EOMI. Head: Atraumatic. ENT:      Ears: Tympanic membranes are mildly injected with mild effusion bilaterally.       Nose: No congestion/rhinnorhea.      Mouth/Throat: Mucous membranes are moist. Posterior pharynx is mildly erythematous.  Hematological/Lymphatic/Immunilogical: No cervical lymphadenopathy.  Cardiovascular: Normal rate, regular rhythm. Normal S1 and S2.  Good peripheral circulation. Respiratory: Normal respiratory effort without tachypnea or retractions. Lungs CTAB. Good air entry to the bases with no decreased or absent breath sounds. Gastrointestinal: Bowel sounds 4  quadrants. Soft and nontender to palpation. No guarding or rigidity. No palpable masses. No distention. No CVA tenderness. Musculoskeletal: Full range of motion to all extremities. No gross deformities appreciated. Neurologic:  Normal speech and language. No gross focal neurologic deficits are appreciated.  Skin:  Skin is warm, dry and intact. No rash noted. Psychiatric: Mood and affect are normal. Speech and behavior are normal. Patient exhibits appropriate insight and judgement.   ____________________________________________   LABS (all labs ordered are listed, but only abnormal results are displayed)  Labs Reviewed  SARS CORONAVIRUS 2 (TAT 6-24 HRS)   ____________________________________________  EKG   ____________________________________________  RADIOLOGY Unk Pinto, personally viewed and evaluated these images (plain radiographs) as part of my medical decision making, as well as reviewing the written report by the radiologist.  DG Chest 1 View  Result Date: 01/08/2019 CLINICAL DATA:  43 year old male with history of cough. EXAM: CHEST  1 VIEW COMPARISON:  No priors. FINDINGS: Lung volumes are normal. No consolidative airspace disease. No pleural effusions. No pneumothorax. No pulmonary nodule or mass noted. Pulmonary vasculature and the cardiomediastinal silhouette are within normal limits. IMPRESSION: No radiographic evidence of acute cardiopulmonary disease. Electronically Signed   By: Vinnie Langton M.D.   On: 01/08/2019 21:25    ____________________________________________    PROCEDURES  Procedure(s) performed:     Procedures     Medications - No data to display   ____________________________________________   INITIAL IMPRESSION / ASSESSMENT AND PLAN / ED COURSE  Pertinent labs & imaging results that were available during my care of the patient were reviewed by me and considered in my medical decision making (see chart for details).       Assessment and Plan:  Viral URI with Cough 43 year old male presents to the emergency department with viral URI-like symptoms for the past 2 to 3 days.  No consolidations, opacities or infiltrates on chest x-ray.  COVID-19 testing is pending at this time.  Rest and hydration were encouraged.  Patient was advised to stay quarantined until COVID-19 results return.  Strict return precautions were given to return with new or worsening symptoms.  All patient questions were answered.  Darren Perry was evaluated in Emergency Department on 01/08/2019 for the symptoms described in the history of present illness. He was evaluated in the context of the global COVID-19 pandemic, which necessitated consideration that the patient might be at risk for infection with the SARS-CoV-2 virus that causes COVID-19. Institutional protocols and algorithms that pertain to the evaluation of patients at risk for COVID-19 are in a state of rapid change based on information released by regulatory bodies including the CDC and federal and state organizations. These policies and algorithms were followed during the patient's care in the ED.    ____________________________________________  FINAL CLINICAL IMPRESSION(S) / ED DIAGNOSES  Final diagnoses:  Viral URI  with cough      NEW MEDICATIONS STARTED DURING THIS VISIT:  ED Discharge Orders    None          This chart was dictated using voice recognition software/Dragon. Despite best efforts to proofread, errors can occur which can change the meaning. Any change was purely unintentional.     Orvil FeilWoods, Artie Takayama M, PA-C 01/08/19 2242    Minna AntisPaduchowski, Kevin, MD 01/10/19 [phone]

## 2019-01-08 NOTE — ED Notes (Signed)
PT reports symptoms x2-3 days. Denies any fevers, only SOB with coughing. NO vomiting but lack of appetite. No known exposures.

## 2019-01-09 ENCOUNTER — Telehealth: Payer: Self-pay | Admitting: Emergency Medicine

## 2019-01-09 LAB — SARS CORONAVIRUS 2 (TAT 6-24 HRS): SARS Coronavirus 2: POSITIVE — AB

## 2019-01-09 NOTE — Telephone Encounter (Signed)
Called patient and gave him positive covid result.  Explained cdc guidelines for isolation and quarantine.

## 2019-01-10 ENCOUNTER — Ambulatory Visit: Payer: 59

## 2019-02-13 ENCOUNTER — Telehealth: Payer: Self-pay | Admitting: *Deleted

## 2019-02-13 NOTE — Telephone Encounter (Addendum)
DOS 02/22/2019 ENDOSCOPIC PLANTAR FASCIOTOMY BILATERAL FOOT - 62229 AND REPAIR POST TIBIAL TENDON LEFT FOOT - 28200  BCBS: Eligibility Date - 01/19/2019  -  01/17/9998  Member Liability Summary       In-Network   Max Per Benefit Period           Year-to-Date Remaining     CoInsurance         Deductible             $3,000.00           $3,000.00     Out-Of-Pocket 3 $5,000.00           $5,000.00  In-Network Copay                             Coinsurance          Authorization Required Not Applicable           20%  per  Service Year               No

## 2019-02-19 DIAGNOSIS — M79676 Pain in unspecified toe(s): Secondary | ICD-10-CM

## 2019-02-22 ENCOUNTER — Other Ambulatory Visit: Payer: Self-pay | Admitting: Podiatry

## 2019-02-22 ENCOUNTER — Encounter: Payer: Self-pay | Admitting: Podiatry

## 2019-02-22 DIAGNOSIS — M76822 Posterior tibial tendinitis, left leg: Secondary | ICD-10-CM | POA: Diagnosis not present

## 2019-02-22 DIAGNOSIS — M722 Plantar fascial fibromatosis: Secondary | ICD-10-CM

## 2019-02-22 MED ORDER — OXYCODONE-ACETAMINOPHEN 5-325 MG PO TABS: 1.0000 | ORAL_TABLET | Freq: Four times a day (QID) | ORAL | 0 refills | Status: DC | PRN

## 2019-02-22 NOTE — Progress Notes (Signed)
PRN postop 

## 2019-03-02 ENCOUNTER — Other Ambulatory Visit: Payer: Self-pay

## 2019-03-02 ENCOUNTER — Encounter: Payer: Self-pay | Admitting: Podiatry

## 2019-03-02 ENCOUNTER — Ambulatory Visit (INDEPENDENT_AMBULATORY_CARE_PROVIDER_SITE_OTHER): Payer: BC Managed Care – PPO | Admitting: Podiatry

## 2019-03-02 DIAGNOSIS — M76822 Posterior tibial tendinitis, left leg: Secondary | ICD-10-CM

## 2019-03-02 DIAGNOSIS — M722 Plantar fascial fibromatosis: Secondary | ICD-10-CM

## 2019-03-02 DIAGNOSIS — Z9889 Other specified postprocedural states: Secondary | ICD-10-CM

## 2019-03-06 NOTE — Progress Notes (Signed)
   Subjective:  Patient presents today status post EPF bilateral and PT tendon repair left. DOS: 02/22/2019. He states he is doing well. He denies any significant pain but reports discomfort from the boot rubbing against the left heel. He has been using the CAM boot on the LLE and the post op shoe on the right foot as directed. Patient is here for further evaluation and treatment.    Past Medical History:  Diagnosis Date  . Depression   . Hypertension       Objective/Physical Exam Neurovascular status intact.  Skin incisions appear to be well coapted with sutures and staples intact. No sign of infectious process noted. No dehiscence. No active bleeding noted. Moderate edema noted to the surgical extremity.  Assessment: 1. s/p EPF bilateral and PT tendon repair left. DOS: 02/22/2019   Plan of Care:  1. Patient was evaluated.  2. Dressing changed. Keep clean, dry and intact for one week.  3. Continue using CAM boot on LLE and post op shoe on right foot.  4. Return to clinic in one week for suture removal.    Felecia Shelling, DPM [facility]  Dr. Felecia Shelling, DPM    [address]                                        [place], [place] [address]                Office [phone]  Fax [phone]

## 2019-03-09 ENCOUNTER — Other Ambulatory Visit: Payer: Self-pay

## 2019-03-09 ENCOUNTER — Ambulatory Visit (INDEPENDENT_AMBULATORY_CARE_PROVIDER_SITE_OTHER): Payer: BC Managed Care – PPO | Admitting: Podiatry

## 2019-03-09 DIAGNOSIS — M76822 Posterior tibial tendinitis, left leg: Secondary | ICD-10-CM

## 2019-03-09 DIAGNOSIS — M722 Plantar fascial fibromatosis: Secondary | ICD-10-CM

## 2019-03-09 DIAGNOSIS — Z9889 Other specified postprocedural states: Secondary | ICD-10-CM

## 2019-03-12 NOTE — Progress Notes (Signed)
   Subjective:  Patient presents today status post EPF bilateral and PT tendon repair left. DOS: 02/22/2019. He states he is doing well and has improved since his last visit. He denies any pain or modifying factors. He has been using the CAM boot and the post op shoe as directed. Patient is here for further evaluation and treatment.    Past Medical History:  Diagnosis Date  . Depression   . Hypertension       Objective/Physical Exam Neurovascular status intact.  Skin incisions appear to be well coapted with sutures and staples intact. No sign of infectious process noted. No dehiscence. No active bleeding noted. Moderate edema noted to the surgical extremity.  Assessment: 1. s/p EPF bilateral and PT tendon repair left. DOS: 02/22/2019   Plan of Care:  1. Patient was evaluated.  2. Sutures removed from EPF sites. Staples left intact.   3. Continue using CAM boot on LLE and post op shoe on right foot.  4. Return to clinic in one week for staples removal and to initiate physical therapy.    Felecia Shelling, DPM [facility]  Dr. Felecia Shelling, DPM    [address]                                        [place], [place] [address]                Office [phone]  Fax [phone]

## 2019-03-20 ENCOUNTER — Other Ambulatory Visit: Payer: Self-pay

## 2019-03-20 ENCOUNTER — Ambulatory Visit (INDEPENDENT_AMBULATORY_CARE_PROVIDER_SITE_OTHER): Payer: BC Managed Care – PPO | Admitting: Podiatry

## 2019-03-20 DIAGNOSIS — M76822 Posterior tibial tendinitis, left leg: Secondary | ICD-10-CM

## 2019-03-20 DIAGNOSIS — Z9889 Other specified postprocedural states: Secondary | ICD-10-CM

## 2019-03-20 DIAGNOSIS — M722 Plantar fascial fibromatosis: Secondary | ICD-10-CM

## 2019-03-23 ENCOUNTER — Encounter: Payer: BC Managed Care – PPO | Admitting: Podiatry

## 2019-03-23 NOTE — Progress Notes (Signed)
   Subjective:  Patient presents today status post EPF bilateral and PT tendon repair left. DOS: 02/22/2019. He states he is doing well and improving. He reports some intermittent pain of the right foot and improving pain in the left foot. He has been using the CAM boot as directed. There are no modifying factors noted at this time. Patient is here for further evaluation and treatment.   Past Medical History:  Diagnosis Date  . Depression   . Hypertension       Objective/Physical Exam Neurovascular status intact.  Skin incisions appear to be well coapted with sutures and staples intact. No sign of infectious process noted. No dehiscence. No active bleeding noted. Moderate edema noted to the surgical extremity.  Assessment: 1. s/p EPF bilateral and PT tendon repair left. DOS: 02/22/2019   Plan of Care:  1. Patient was evaluated.  2. Staples removed.  3. Continue using CAM boot. Weightbearing as tolerated.  4. Physical therapy ordered from [facility].  5. Return to clinic in 4 weeks.    Felecia Shelling, DPM [facility]  Dr. Felecia Shelling, DPM    [address]                                        [place], [place] [address]                Office [phone]  Fax [phone]

## 2019-04-17 ENCOUNTER — Ambulatory Visit (INDEPENDENT_AMBULATORY_CARE_PROVIDER_SITE_OTHER): Payer: BC Managed Care – PPO | Admitting: Podiatry

## 2019-04-17 ENCOUNTER — Encounter: Payer: Self-pay | Admitting: Podiatry

## 2019-04-17 ENCOUNTER — Other Ambulatory Visit: Payer: Self-pay

## 2019-04-17 VITALS — Temp 98.2°F

## 2019-04-17 DIAGNOSIS — Z9889 Other specified postprocedural states: Secondary | ICD-10-CM

## 2019-04-18 NOTE — Progress Notes (Signed)
   Subjective:  Patient presents today status post EPF bilateral and PT tendon repair left. DOS: 02/22/2019. He states he is doing well and improving. He reports some continued left ankle pain but states it is getting better. He has been doing physical therapy which he states it is helping. He has been using the CAM boot as directed. Patient is here for further evaluation and treatment.   Past Medical History:  Diagnosis Date  . Depression   . Hypertension       Objective/Physical Exam Neurovascular status intact.  Skin incisions appear to be well coapted. No sign of infectious process noted. No dehiscence. No active bleeding noted. Moderate edema noted to the surgical extremity.  Assessment: 1. s/p EPF bilateral and PT tendon repair left. DOS: 02/22/2019   Plan of Care:  1. Patient was evaluated.  2. Continue physical therapy twice weekly.  3. Transition out of CAM boot into good shoe gear with ankle brace.  4. No work through the end of April 2021.  5. Ankle brace dispensed.  6. Return to clinic in 4 weeks.     Felecia Shelling, DPM [facility]  Dr. Felecia Shelling, DPM    [address]                                        [place], [place] [address]                Office [phone]  Fax [phone]

## 2019-05-15 ENCOUNTER — Other Ambulatory Visit: Payer: Self-pay

## 2019-05-15 ENCOUNTER — Ambulatory Visit (INDEPENDENT_AMBULATORY_CARE_PROVIDER_SITE_OTHER): Payer: BC Managed Care – PPO | Admitting: Podiatry

## 2019-05-15 DIAGNOSIS — Z9889 Other specified postprocedural states: Secondary | ICD-10-CM

## 2019-05-15 DIAGNOSIS — M76822 Posterior tibial tendinitis, left leg: Secondary | ICD-10-CM

## 2019-05-15 DIAGNOSIS — M722 Plantar fascial fibromatosis: Secondary | ICD-10-CM

## 2019-05-17 NOTE — Progress Notes (Signed)
   Subjective:  Patient presents today status post EPF bilateral and PT tendon repair left. DOS: 02/22/2019. He states he is doing well. He reports some mild intermittent aching but nothing he is concerned about. He has been using the insoles and ankle brace as directed. There are no worsening factors noted. Patient is here for further evaluation and treatment.   Past Medical History:  Diagnosis Date  . Depression   . Hypertension       Objective/Physical Exam Neurovascular status intact.  Skin incisions appear to be well coapted. No sign of infectious process noted. No dehiscence. No active bleeding noted. Moderate edema noted to the surgical extremity.  Assessment: 1. s/p EPF bilateral and PT tendon repair left. DOS: 02/22/2019   Plan of Care:  1. Patient was evaluated.  2. Return to work on 05/21/2019 full activity with no restrictions.  3. Continue using OTC Powerstep insoles.  4. Continue using ankle brace as needed.  5. Return to clinic as needed.     Felecia Shelling, DPM [facility]  Dr. Felecia Shelling, DPM    [address]                                        [place], [place] [address]                Office [phone]  Fax [phone]

## 2019-06-08 ENCOUNTER — Telehealth: Payer: Self-pay | Admitting: *Deleted

## 2019-06-08 NOTE — Telephone Encounter (Signed)
FMLA Paperwork complete

## 2020-06-02 ENCOUNTER — Other Ambulatory Visit: Payer: Self-pay | Admitting: Family Medicine

## 2020-06-02 DIAGNOSIS — R1011 Right upper quadrant pain: Secondary | ICD-10-CM

## 2020-06-03 ENCOUNTER — Ambulatory Visit
Admission: RE | Admit: 2020-06-03 | Discharge: 2020-06-03 | Disposition: A | Discharge status: Home / Self Care | Payer: BC Managed Care – PPO | Source: Ambulatory Visit | Attending: Family Medicine | Admitting: Family Medicine

## 2020-06-03 ENCOUNTER — Other Ambulatory Visit: Payer: Self-pay

## 2020-06-03 DIAGNOSIS — R1011 Right upper quadrant pain: Secondary | ICD-10-CM | POA: Insufficient documentation

## 2020-06-04 ENCOUNTER — Other Ambulatory Visit: Payer: Self-pay

## 2020-06-04 ENCOUNTER — Encounter: Payer: Self-pay | Admitting: Emergency Medicine

## 2020-06-04 ENCOUNTER — Emergency Department
Admission: EM | Admit: 2020-06-04 | Discharge: 2020-06-04 | Disposition: A | Discharge status: Home / Self Care | Payer: BC Managed Care – PPO | Attending: Emergency Medicine | Admitting: Emergency Medicine

## 2020-06-04 DIAGNOSIS — K8020 Calculus of gallbladder without cholecystitis without obstruction: Secondary | ICD-10-CM | POA: Insufficient documentation

## 2020-06-04 DIAGNOSIS — I10 Essential (primary) hypertension: Secondary | ICD-10-CM | POA: Insufficient documentation

## 2020-06-04 DIAGNOSIS — R1011 Right upper quadrant pain: Secondary | ICD-10-CM | POA: Diagnosis present

## 2020-06-04 LAB — COMPREHENSIVE METABOLIC PANEL
ALT: 34 U/L (ref 0–44)
AST: 22 U/L (ref 15–41)
Albumin: 4.5 g/dL (ref 3.5–5.0)
Alkaline Phosphatase: 36 U/L — ABNORMAL LOW (ref 38–126)
Anion gap: 10 (ref 5–15)
BUN: 13 mg/dL (ref 6–20)
CO2: 23 mmol/L (ref 22–32)
Calcium: 9.4 mg/dL (ref 8.9–10.3)
Chloride: 108 mmol/L (ref 98–111)
Creatinine, Ser: 1.16 mg/dL (ref 0.61–1.24)
GFR, Estimated: >60 mL/min (ref >60)
Glucose, Bld: 116 mg/dL — ABNORMAL HIGH (ref 70–99)
Potassium: 4 mmol/L (ref 3.5–5.1)
Sodium: 141 mmol/L (ref 135–145)
Total Bilirubin: 1 mg/dL (ref 0.3–1.2)
Total Protein: 7.8 g/dL (ref 6.5–8.1)

## 2020-06-04 LAB — CBC
HCT: 34 % — ABNORMAL LOW (ref 39.0–52.0)
Hemoglobin: 12 g/dL — ABNORMAL LOW (ref 13.0–17.0)
MCH: 36.6 pg — ABNORMAL HIGH (ref 26.0–34.0)
MCHC: 35.3 g/dL (ref 30.0–36.0)
MCV: 103.7 fL — ABNORMAL HIGH (ref 80.0–100.0)
Platelets: 333 10*3/uL (ref 150–400)
RBC: 3.28 MIL/uL — ABNORMAL LOW (ref 4.22–5.81)
RDW: 14.3 % (ref 11.5–15.5)
WBC: 4.2 10*3/uL (ref 4.0–10.5)
nRBC: 0 % (ref 0.0–0.2)

## 2020-06-04 LAB — LIPASE, BLOOD: Lipase: 27 U/L (ref 11–51)

## 2020-06-04 MED ORDER — OXYCODONE-ACETAMINOPHEN 5-325 MG PO TABS
1.0000 | ORAL_TABLET | Freq: Once | ORAL | Status: AC
  Administered 2020-06-04: 1 via ORAL
  Filled 2020-06-04: qty 1

## 2020-06-04 MED ORDER — ONDANSETRON HCL 4 MG PO TABS: 4.0000 mg | ORAL_TABLET | Freq: Three times a day (TID) | ORAL | 0 refills | Status: DC | PRN

## 2020-06-04 MED ORDER — OXYCODONE-ACETAMINOPHEN 5-325 MG PO TABS: 1.0000 | ORAL_TABLET | Freq: Three times a day (TID) | ORAL | 0 refills | Status: DC | PRN

## 2020-06-04 MED ORDER — ONDANSETRON 4 MG PO TBDP
4.0000 mg | ORAL_TABLET | Freq: Once | ORAL | Status: AC
  Administered 2020-06-04: 4 mg via ORAL
  Filled 2020-06-04: qty 1

## 2020-06-04 NOTE — ED Provider Notes (Signed)
[facility] Emergency Department Provider Note  ____________________________________________   Event Date/Time   First MD Initiated Contact with Patient 06/04/20 1455     (approximate)  I have reviewed the triage vital signs and the nursing notes.   HISTORY  Chief Complaint Abdominal Pain   HPI Darren Perry is a 45 y.o. male with a past medical history of HTN, depression and obesity who presents for assessment approximately 2 weeks of right upper quadrant abdominal pain.  Patient states he got ultrasound yesterday but is not sure of the results.  His pain is not any different today than usual but he feels is not getting better.  He states that he often feels he has a baseline soreness right upper abdomen but there is often get worse especially after meals.  He endorses some nausea but has not any vomiting, fevers, headache and earache, sore throat, chest pain, cough, shortness of breath, left side abdominal pain or back pain, diarrhea or urinary symptoms.  No recent injuries falls or rashes.  No prior similar episodes.  Other than food no clear leaving aggravating factors.  No other acute concerns at this time.  He denies any significant EtOH or illicit drug use.         Past Medical History:  Diagnosis Date  . Depression   . Hypertension     Patient Active Problem List   Diagnosis Date Noted  . Depression 11/02/2016  . Hypertension 11/02/2016  . Obesity (BMI 30.0-34.9) 06/15/2016    Past Surgical History:  Procedure Laterality Date  . CORNEAL TRANSPLANT    . KNEE ARTHROSCOPY      Prior to Admission medications   Medication Sig Start Date End Date Taking? Authorizing Provider  ondansetron (ZOFRAN) 4 MG tablet Take 1 tablet (4 mg total) by mouth every 8 (eight) hours as needed for up to 10 doses for nausea or vomiting. 06/04/20  Yes Gilles Chiquito, MD  oxyCODONE-acetaminophen (PERCOCET) 5-325 MG tablet Take 1 tablet by mouth every 8 (eight)  hours as needed for up to 5 days for severe pain. 06/04/20 06/09/20 Yes Gilles Chiquito, MD  guaiFENesin-codeine 100-10 MG/5ML syrup Take 5 mLs by mouth every 6 (six) hours as needed for cough. 03/06/17   Tommi Rumps, PA-C  hydrochlorothiazide (HYDRODIURIL) 25 MG tablet Take by mouth. 01/07/16 03/06/17  [provider]  meloxicam (MOBIC) 15 MG tablet Take 1 tablet (15 mg total) by mouth daily. 04/12/17   Felecia Shelling, DPM  methylPREDNISolone (MEDROL DOSEPAK) 4 MG TBPK tablet 6 day dose pack - take as directed 02/21/18   Felecia Shelling, DPM  sertraline (ZOLOFT) 50 MG tablet Take 50 mg by mouth daily.    [provider]    Allergies Patient has no known allergies.  Family History  Problem Relation Age of Onset  . Hypertension Mother   . Diabetes Mother   . Cancer Mother   . Hypertension Father     Social History Social History   Tobacco Use  . Smoking status: Never Smoker  . Smokeless tobacco: Never Used  Substance Use Topics  . Alcohol use: No  . Drug use: No    Review of Systems  Review of Systems  Constitutional: Negative for fever.  HENT: Negative for sore throat.   Eyes: Negative for pain.  Respiratory: Negative for cough and stridor.   Cardiovascular: Negative for chest pain.  Gastrointestinal: Positive for abdominal pain and nausea. Negative for vomiting.  Genitourinary: Negative  for dysuria.  Musculoskeletal: Negative for myalgias.  Skin: Negative for rash.  Neurological: Negative for seizures, loss of consciousness and headaches.  Psychiatric/Behavioral: Negative for suicidal ideas.  All other systems reviewed and are negative.     ____________________________________________   PHYSICAL EXAM:  VITAL SIGNS: ED Triage Vitals  Enc Vitals Group     BP 06/04/20 1303 (!) 144/97     Pulse Rate 06/04/20 1303 99     Resp 06/04/20 1303 18     Temp 06/04/20 1303 98.3 F (36.8 C)     Temp Source 06/04/20 1303 Oral     SpO2 06/04/20 1303 97  %     Weight 06/04/20 1303 235 lb (106.6 kg)     Height 06/04/20 1303 5\' 8"  (1.727 m)     Head Circumference --      Peak Flow --      Pain Score 06/04/20 1303 1     Pain Loc --      Pain Edu? --      Excl. in GC? --    Vitals:   06/04/20 1303  BP: (!) 144/97  Pulse: 99  Resp: 18  Temp: 98.3 F (36.8 C)  SpO2: 97%   Physical Exam Vitals and nursing note reviewed.  Constitutional:      Appearance: He is well-developed.  HENT:     Head: Normocephalic and atraumatic.     Right Ear: External ear normal.     Left Ear: External ear normal.     Nose: Nose normal.  Eyes:     Conjunctiva/sclera: Conjunctivae normal.  Cardiovascular:     Rate and Rhythm: Normal rate and regular rhythm.     Heart sounds: No murmur heard.   Pulmonary:     Effort: Pulmonary effort is normal. No respiratory distress.  Abdominal:     Palpations: Abdomen is soft.     Tenderness: There is abdominal tenderness in the right upper quadrant. There is no right CVA tenderness, left CVA tenderness or guarding.  Musculoskeletal:     Cervical back: Neck supple.  Skin:    General: Skin is warm and dry.     Capillary Refill: Capillary refill takes less than 2 seconds.  Neurological:     Mental Status: He is alert and oriented to person, place, and time.  Psychiatric:        Mood and Affect: Mood normal.      ____________________________________________   LABS (all labs ordered are listed, but only abnormal results are displayed)  Labs Reviewed  COMPREHENSIVE METABOLIC PANEL - Abnormal; Notable for the following components:      Result Value   Glucose, Bld 116 (*)    Alkaline Phosphatase 36 (*)    All other components within normal limits  CBC - Abnormal; Notable for the following components:   RBC 3.28 (*)    Hemoglobin 12.0 (*)    HCT 34.0 (*)    MCV 103.7 (*)    MCH 36.6 (*)    All other components within normal limits  LIPASE, BLOOD  URINALYSIS, COMPLETE (UACMP) WITH MICROSCOPIC    ____________________________________________  EKG ____________________________________________  RADIOLOGY  ED MD interpretation: Cholelithiasis without evidence of cholecystitis.  Steatosis without any other clear acute pathology noted.  CBD is within normal limits.  Official radiology report(s): 06/06/20 Abdomen Limited RUQ (LIVER/GB)  Result Date: 06/04/2020 CLINICAL DATA:  Right upper quadrant pain for 2 weeks. EXAM: ULTRASOUND ABDOMEN LIMITED RIGHT UPPER QUADRANT COMPARISON:  None. FINDINGS: Gallbladder: Multiple  gallstones are seen measure up to 1.1 cm in diameter. No evidence of gallbladder dilatation or wall thickening. No sonographic Murphy sign noted by sonographer. Common bile duct: Diameter: 3 mm, within normal limits. Liver: Diffusely increased echogenicity of the hepatic parenchyma, consistent with hepatic steatosis. No hepatic mass identified. Small areas of focal fatty sparing noted adjacent to the gallbladder fossa. Portal vein is patent on color Doppler imaging with normal direction of blood flow towards the liver. Other: None. IMPRESSION: Cholelithiasis. No sonographic signs of cholecystitis or biliary ductal dilatation. Diffuse hepatic steatosis. Electronically Signed   By: Danae Orleans M.D.   On: 06/04/2020 14:17    ____________________________________________   PROCEDURES  Procedure(s) performed (including Critical Care):  Procedures   ____________________________________________   INITIAL IMPRESSION / ASSESSMENT AND PLAN / ED COURSE      Presents with above to history exam for approximately 2 weeks of right upper quadrant abdominal pain associate with some nausea worsened by meals.  On arrival he is afebrile hemodynamically stable.  He did get an ultrasound yesterday with is not sure of the results.  On arrival to the emergency room he has tenderness right upper quadrant.  I was able to review the results of this which showed cholelithiasis without evidence of  cholecystitis.  Given he has not had any change in his symptoms today and has no fever or leukocytosis with very mild tenderness in the right upper quadrant I have a low suspicion for interim development of acute cholecystitis cholangitis or other emergent complication.  Lipase is not consistent with acute pancreatitis.  CMP shows no significant electrode or metabolic derangements.  No evidence of significant cholestasis.  CBC is unremarkable.  Low suspicion for appendicitis, kidney stone, pyelonephritis or other immediate life-threatening pathology.  Given duration of symptoms with no significant change today and no fever or leukocytosis or other historical or exam features to suggest sepsis or development of acute cholecystitis I think he is safe for discharge with plan for close outpatient surgery follow-up.  Rx written for Percocet and Zofran.  Discharged stable condition.  Strict return precautions advised and discussed.       ____________________________________________   FINAL CLINICAL IMPRESSION(S) / ED DIAGNOSES  Final diagnoses:  Symptomatic cholelithiasis    Medications  oxyCODONE-acetaminophen (PERCOCET/ROXICET) 5-325 MG per tablet 1 tablet (1 tablet Oral Given 06/04/20 1525)  ondansetron (ZOFRAN-ODT) disintegrating tablet 4 mg (4 mg Oral Given 06/04/20 1525)     ED Discharge Orders         Ordered    oxyCODONE-acetaminophen (PERCOCET) 5-325 MG tablet  Every 8 hours PRN        06/04/20 1521    ondansetron (ZOFRAN) 4 MG tablet  Every 8 hours PRN        06/04/20 1521           Note:  This document was prepared using Dragon voice recognition software and may include unintentional dictation errors.   Gilles Chiquito, MD 06/04/20 [phone]

## 2020-06-04 NOTE — ED Provider Notes (Signed)
Emergency Medicine Provider Triage Evaluation Note  Darren Perry , a 45 y.o. male  was evaluated in triage.  Pt complains of right upper quadrant abdominal pain.  Review of Systems  Positive: Pain, nausea. Negative: Vomiting, fever.  Physical Exam  BP (!) 144/97 (BP Location: Left Arm)   Pulse 99   Temp 98.3 F (36.8 C) (Oral)   Resp 18   Ht 5\' 8"  (1.727 m)   Wt 106.6 kg   SpO2 97%   BMI 35.73 kg/m  Gen:   Awake, no distress Resp:  Normal effort  MSK:   Moves extremities without difficulty   Medical Decision Making  Medically screening exam initiated at 2:07 PM.  Appropriate orders placed.   The patient had an outpatient ultrasound performed yesterday.  I am able to see the images, however there is no read yet.  I have called over to [facility] radiology so that we can obtain a reading on the ultrasound.  Darren Perry was informed that the remainder of the evaluation will be completed by another provider, this initial triage assessment does not replace that evaluation, and the importance of remaining in the ED until their evaluation is complete.    Mohammed Kindle, MD 06/04/20 1408

## 2020-06-04 NOTE — ED Triage Notes (Signed)
Pt comes into the ED via POV c/o RUQ abdominal pain x 2 weeks.  Pt completed outpatient imaging yesterday but the pain and nausea has gotten no better and he has not received the answers from the imaging.  Pt ambulatory to triage at this time and is in NAD.

## 2020-06-05 ENCOUNTER — Telehealth: Payer: Self-pay | Admitting: General Surgery

## 2020-06-05 ENCOUNTER — Ambulatory Visit (INDEPENDENT_AMBULATORY_CARE_PROVIDER_SITE_OTHER): Payer: BC Managed Care – PPO | Admitting: General Surgery

## 2020-06-05 ENCOUNTER — Encounter: Payer: Self-pay | Admitting: General Surgery

## 2020-06-05 VITALS — BP 155/78 | HR 98 | Temp 98.0°F | Ht 68.0 in | Wt 234.6 lb

## 2020-06-05 DIAGNOSIS — K8020 Calculus of gallbladder without cholecystitis without obstruction: Secondary | ICD-10-CM

## 2020-06-05 NOTE — H&P (View-Only) (Signed)
Patient ID: Darren Perry, male   DOB: 18-Sep-1975, 45 y.o.   MRN: [id number]  No chief complaint on file.   HPI Darren Perry is a 45 y.o. male.   He is here today for follow-up from an emergency department visit yesterday for abdominal pain.  He says that he has been experiencing abdominal pain for roughly 2 weeks.  He saw his primary care provider who ordered a right upper quadrant ultrasound.  This was done 2 days ago, but his pain became so severe that he went to the emergency department yesterday.  The ultrasound showed cholelithiasis without evidence of cholecystitis.  Darren Perry continues to have pain.  It waxes and wanes, but is always present.  He says that it is exacerbated by eating or drinking anything, regardless of fat content.  He feels like he has chills associated with the pain, but denies any fevers.  He endorses nausea without emesis.  No diarrhea, but he does have chronic constipation.  The pain is located primarily in his right upper quadrant but does radiate around his side to his back and also seems to penetrate directly through his epigastrium to his back.  Nothing really seems to alleviate it, other than time.  He has not taken any medication, however.  He has never had any prior abdominal surgery, but states that several of his family members have had their gallbladder out.  He has never had any jaundice, pancreatitis, acholic stool, or dark, [employer]a-colored urine.   Past Medical History:  Diagnosis Date  . Depression   . Hypertension     Past Surgical History:  Procedure Laterality Date  . CORNEAL TRANSPLANT    . KNEE ARTHROSCOPY Left   . plantar fasciitis      Family History  Problem Relation Age of Onset  . Hypertension Mother   . Diabetes Mother   . Cancer Mother   . Hypertension Father     Social History Social History   Tobacco Use  . Smoking status: Never Smoker  . Smokeless tobacco: Never Used  Vaping Use  . Vaping Use: Never used  Substance  Use Topics  . Alcohol use: No  . Drug use: No    No Known Allergies  Current Outpatient Medications  Medication Sig Dispense Refill  . ondansetron (ZOFRAN) 4 MG tablet Take 1 tablet (4 mg total) by mouth every 8 (eight) hours as needed for up to 10 doses for nausea or vomiting. 10 tablet 0  . oxyCODONE-acetaminophen (PERCOCET) 5-325 MG tablet Take 1 tablet by mouth every 8 (eight) hours as needed for up to 5 days for severe pain. 10 tablet 0   No current facility-administered medications for this visit.    Review of Systems Review of Systems  All other systems reviewed and are negative. Or as discussed in the history of present illness.  Blood pressure (!) 155/78, pulse 98, temperature 98 F (36.7 C), height 5\' 8"  (1.727 m), weight 234 lb 9.6 oz (106.4 kg), SpO2 96 %. Body mass index is 35.67 kg/m.  Physical Exam Physical Exam Constitutional:      General: He is not in acute distress.    Appearance: He is obese.  HENT:     Head: Normocephalic and atraumatic.     Nose:     Comments: Covered with a mask    Mouth/Throat:     Comments: Covered with a mask Neck:     Comments: The trachea is midline.  There is no palpable cervical or  supraclavicular lymphadenopathy.  No thyromegaly or dominant thyroid masses appreciated.  The gland moves freely with deglutition. Cardiovascular:     Rate and Rhythm: Normal rate and regular rhythm.     Pulses: Normal pulses.  Pulmonary:     Effort: Pulmonary effort is normal. No respiratory distress.     Breath sounds: Normal breath sounds.  Abdominal:     General: Bowel sounds are normal.     Palpations: Abdomen is soft.     Tenderness: There is abdominal tenderness. There is no guarding.     Comments: He is tender to palpation in the right upper quadrant and epigastrium.  Murphy sign is negative.  Genitourinary:    Comments: Deferred Musculoskeletal:        General: No swelling, tenderness or deformity.     Cervical back: No rigidity.   Skin:    General: Skin is warm and dry.  Neurological:     General: No focal deficit present.     Mental Status: He is alert and oriented to person, place, and time.  Psychiatric:        Mood and Affect: Mood normal.        Behavior: Behavior normal.     Data Reviewed I reviewed the primary care provider's note from May 29, 2020, where he and he suspected a gallbladder etiology for Darren Perry pain.  I reviewed the ultrasound imaging performed on Jun 03, 2020.  I concur with the radiology interpretation which is copied here:  CLINICAL DATA:  Right upper quadrant pain for 2 weeks.  EXAM: ULTRASOUND ABDOMEN LIMITED RIGHT UPPER QUADRANT  COMPARISON:  None.  FINDINGS: Gallbladder:  Multiple gallstones are seen measure up to 1.1 cm in diameter. No evidence of gallbladder dilatation or wall thickening. No sonographic Murphy sign noted by sonographer.  Common bile duct:  Diameter: 3 mm, within normal limits.  Liver:  Diffusely increased echogenicity of the hepatic parenchyma, consistent with hepatic steatosis. No hepatic mass identified. Small areas of focal fatty sparing noted adjacent to the gallbladder fossa. Portal vein is patent on color Doppler imaging with normal direction of blood flow towards the liver.  Other: None.  IMPRESSION: Cholelithiasis. No sonographic signs of cholecystitis or biliary ductal dilatation.  Diffuse hepatic steatosis.  Results for Darren Perry (MRN [id number]) as of 06/05/2020 11:24  Ref. Range 06/04/2020 13:05  Sodium Latest Ref Range: 135 - 145 mmol/L 141  Potassium Latest Ref Range: 3.5 - 5.1 mmol/L 4.0  Chloride Latest Ref Range: 98 - 111 mmol/L 108  CO2 Latest Ref Range: 22 - 32 mmol/L 23  Glucose Latest Ref Range: 70 - 99 mg/dL 482 (H)  BUN Latest Ref Range: 6 - 20 mg/dL 13  Creatinine Latest Ref Range: 0.61 - 1.24 mg/dL 7.07  Calcium Latest Ref Range: 8.9 - 10.3 mg/dL 9.4  Anion gap Latest Ref Range: 5 - 15  10   Alkaline Phosphatase Latest Ref Range: 38 - 126 U/L 36 (L)  Albumin Latest Ref Range: 3.5 - 5.0 g/dL 4.5  Lipase Latest Ref Range: 11 - 51 U/L 27  AST Latest Ref Range: 15 - 41 U/L 22  ALT Latest Ref Range: 0 - 44 U/L 34  Total Protein Latest Ref Range: 6.5 - 8.1 g/dL 7.8  Total Bilirubin Latest Ref Range: 0.3 - 1.2 mg/dL 1.0  GFR, Estimated Latest Ref Range: >60 mL/min >60  WBC Latest Ref Range: 4.0 - 10.5 K/uL 4.2  RBC Latest Ref Range: 4.22 - 5.81 MIL/uL 3.28 (L)  Hemoglobin Latest Ref Range: 13.0 - 17.0 g/dL 62.9 (L)  HCT Latest Ref Range: 39.0 - 52.0 % 34.0 (L)  MCV Latest Ref Range: 80.0 - 100.0 fL 103.7 (H)  MCH Latest Ref Range: 26.0 - 34.0 pg 36.6 (H)  MCHC Latest Ref Range: 30.0 - 36.0 g/dL 47.6  RDW Latest Ref Range: 11.5 - 15.5 % 14.3  Platelets Latest Ref Range: 150 - 400 K/uL 333  nRBC Latest Ref Range: 0.0 - 0.2 % 0.0  These labs from the emergency department yesterday are essentially unremarkable, aside from some mild anemia.  Specifically, there is no leukocytosis, no transaminitis, nor any elevation in the bilirubin.  Assessment This is a 45 year old man with symptomatic cholelithiasis.  His pain has been fairly unrelenting for nearly 2 weeks.  There is no evidence for cholecystitis.  Plan I have recommended that he undergo a robot-assisted laparoscopic cholecystectomy.  The risks of the operation were discussed with him.  These include, but are not limited to, bleeding, infection, damage to surrounding tissues or structures, retained stone, bile leak, need for additional interventions or procedures, need to convert to an open operation, among others.  He had the opportunity to ask questions and these were answered to his satisfaction.  We will work on getting him scheduled at the soonest possible date.    Duanne Guess 06/05/2020, 11:18 AM

## 2020-06-05 NOTE — Progress Notes (Signed)
Patient ID: Darren Perry, male   DOB: 18-Sep-1975, 45 y.o.   MRN: [id number]  No chief complaint on file.   HPI Darren Perry is a 45 y.o. male.   He is here today for follow-up from an emergency department visit yesterday for abdominal Perry.  He says that he has been experiencing abdominal Perry for roughly 2 weeks.  He saw his primary care provider who ordered a right upper quadrant ultrasound.  This was done 2 days ago, but his Perry became so severe that he went to the emergency department yesterday.  The ultrasound showed cholelithiasis without evidence of cholecystitis.  Darren Perry continues to have Perry.  It waxes and wanes, but is always present.  He says that it is exacerbated by eating or drinking anything, regardless of fat content.  He feels like he has chills associated with the Perry, but denies any fevers.  He endorses nausea without emesis.  No diarrhea, but he does have chronic constipation.  The Perry is located primarily in his right upper quadrant but does radiate around his side to his back and also seems to penetrate directly through his epigastrium to his back.  Nothing really seems to alleviate it, other than time.  He has not taken any medication, however.  He has never had any prior abdominal surgery, but states that several of his family members have had their gallbladder out.  He has never had any jaundice, pancreatitis, acholic stool, or dark, Coca-Cola-colored urine.   Past Medical History:  Diagnosis Date  . Depression   . Hypertension     Past Surgical History:  Procedure Laterality Date  . CORNEAL TRANSPLANT    . KNEE ARTHROSCOPY Left   . plantar fasciitis      Family History  Problem Relation Age of Onset  . Hypertension Mother   . Diabetes Mother   . Cancer Mother   . Hypertension Father     Social History Social History   Tobacco Use  . Smoking status: Never Smoker  . Smokeless tobacco: Never Used  Vaping Use  . Vaping Use: Never used  Substance  Use Topics  . Alcohol use: No  . Drug use: No    No Known Allergies  Current Outpatient Medications  Medication Sig Dispense Refill  . ondansetron (ZOFRAN) 4 MG tablet Take 1 tablet (4 mg total) by mouth every 8 (eight) hours as needed for up to 10 doses for nausea or vomiting. 10 tablet 0  . oxyCODONE-acetaminophen (PERCOCET) 5-325 MG tablet Take 1 tablet by mouth every 8 (eight) hours as needed for up to 5 days for severe Perry. 10 tablet 0   No current facility-administered medications for this visit.    Review of Systems Review of Systems  All other systems reviewed and are negative. Or as discussed in the history of present illness.  Blood pressure (!) 155/78, pulse 98, temperature 98 F (36.7 C), height 5\' 8"  (1.727 m), weight 234 lb 9.6 oz (106.4 kg), SpO2 96 %. Body mass index is 35.67 kg/m.  Physical Exam Physical Exam Constitutional:      General: He is not in acute distress.    Appearance: He is obese.  HENT:     Head: Normocephalic and atraumatic.     Nose:     Comments: Covered with a mask    Mouth/Throat:     Comments: Covered with a mask Neck:     Comments: The trachea is midline.  There is no palpable cervical or  supraclavicular lymphadenopathy.  No thyromegaly or dominant thyroid masses appreciated.  The gland moves freely with deglutition. Cardiovascular:     Rate and Rhythm: Normal rate and regular rhythm.     Pulses: Normal pulses.  Pulmonary:     Effort: Pulmonary effort is normal. No respiratory distress.     Breath sounds: Normal breath sounds.  Abdominal:     General: Bowel sounds are normal.     Palpations: Abdomen is soft.     Tenderness: There is abdominal tenderness. There is no guarding.     Comments: He is tender to palpation in the right upper quadrant and epigastrium.  Murphy sign is negative.  Genitourinary:    Comments: Deferred Musculoskeletal:        General: No swelling, tenderness or deformity.     Cervical back: No rigidity.   Skin:    General: Skin is warm and dry.  Neurological:     General: No focal deficit present.     Mental Status: He is alert and oriented to person, place, and time.  Psychiatric:        Mood and Affect: Mood normal.        Behavior: Behavior normal.     Data Reviewed I reviewed the primary care provider's note from May 29, 2020, where he and he suspected a gallbladder etiology for Darren Perry.  I reviewed the ultrasound imaging performed on Jun 03, 2020.  I concur with the radiology interpretation which is copied here:  CLINICAL DATA:  Right upper quadrant Perry for 2 weeks.  EXAM: ULTRASOUND ABDOMEN LIMITED RIGHT UPPER QUADRANT  COMPARISON:  None.  FINDINGS: Gallbladder:  Multiple gallstones are seen measure up to 1.1 cm in diameter. No evidence of gallbladder dilatation or wall thickening. No sonographic Murphy sign noted by sonographer.  Common bile duct:  Diameter: 3 mm, within normal limits.  Liver:  Diffusely increased echogenicity of the hepatic parenchyma, consistent with hepatic steatosis. No hepatic mass identified. Small areas of focal fatty sparing noted adjacent to the gallbladder fossa. Portal vein is patent on color Doppler imaging with normal direction of blood flow towards the liver.  Other: None.  IMPRESSION: Cholelithiasis. No sonographic signs of cholecystitis or biliary ductal dilatation.  Diffuse hepatic steatosis.  Results for Darren Perry, Darren Perry (MRN [id number]) as of 06/05/2020 11:24  Ref. Range 06/04/2020 13:05  Sodium Latest Ref Range: 135 - 145 mmol/L 141  Potassium Latest Ref Range: 3.5 - 5.1 mmol/L 4.0  Chloride Latest Ref Range: 98 - 111 mmol/L 108  CO2 Latest Ref Range: 22 - 32 mmol/L 23  Glucose Latest Ref Range: 70 - 99 mg/dL 482 (H)  BUN Latest Ref Range: 6 - 20 mg/dL 13  Creatinine Latest Ref Range: 0.61 - 1.24 mg/dL 7.07  Calcium Latest Ref Range: 8.9 - 10.3 mg/dL 9.4  Anion gap Latest Ref Range: 5 - 15  10   Alkaline Phosphatase Latest Ref Range: 38 - 126 U/L 36 (L)  Albumin Latest Ref Range: 3.5 - 5.0 g/dL 4.5  Lipase Latest Ref Range: 11 - 51 U/L 27  AST Latest Ref Range: 15 - 41 U/L 22  ALT Latest Ref Range: 0 - 44 U/L 34  Total Protein Latest Ref Range: 6.5 - 8.1 g/dL 7.8  Total Bilirubin Latest Ref Range: 0.3 - 1.2 mg/dL 1.0  GFR, Estimated Latest Ref Range: >60 mL/min >60  WBC Latest Ref Range: 4.0 - 10.5 K/uL 4.2  RBC Latest Ref Range: 4.22 - 5.81 MIL/uL 3.28 (L)  Hemoglobin Latest Ref Range: 13.0 - 17.0 g/dL 62.9 (L)  HCT Latest Ref Range: 39.0 - 52.0 % 34.0 (L)  MCV Latest Ref Range: 80.0 - 100.0 fL 103.7 (H)  MCH Latest Ref Range: 26.0 - 34.0 pg 36.6 (H)  MCHC Latest Ref Range: 30.0 - 36.0 g/dL 47.6  RDW Latest Ref Range: 11.5 - 15.5 % 14.3  Platelets Latest Ref Range: 150 - 400 K/uL 333  nRBC Latest Ref Range: 0.0 - 0.2 % 0.0  These labs from the emergency department yesterday are essentially unremarkable, aside from some mild anemia.  Specifically, there is no leukocytosis, no transaminitis, nor any elevation in the bilirubin.  Assessment This is a 45 year old man with symptomatic cholelithiasis.  His Perry has been fairly unrelenting for nearly 2 weeks.  There is no evidence for cholecystitis.  Plan I have recommended that he undergo a robot-assisted laparoscopic cholecystectomy.  The risks of the operation were discussed with him.  These include, but are not limited to, bleeding, infection, damage to surrounding tissues or structures, retained stone, bile leak, need for additional interventions or procedures, need to convert to an open operation, among others.  He had the opportunity to ask questions and these were answered to his satisfaction.  We will work on getting him scheduled at the soonest possible date.    Duanne Guess 06/05/2020, 11:18 AM

## 2020-06-05 NOTE — Telephone Encounter (Signed)
Patient has been advised of Pre-Admission date/time, COVID Testing date and Surgery date.  Surgery Date: 06/09/20 Preadmission Testing Date: 06/06/20 (phone 8a-1p) Covid Testing Date: Not needed.   -   Patient has been made aware to call [phone], between 1-3:00pm the day before surgery, to find out what time to arrive for surgery.

## 2020-06-05 NOTE — Patient Instructions (Signed)
You have requested to have your gallbladder removed. This will be done on at [facility] with Dr. Lady Gary.  You will most likely be out of work 1-2 weeks for this surgery. You will return after your post-op appointment with a lifting restriction for approximately 4 more weeks.  You will be able to eat anything you would like to following surgery. But, start by eating a bland diet and advance this as tolerated. The Gallbladder diet is below, please go as closely by this diet as possible prior to surgery to avoid any further attacks.  Please see the (blue)pre-care form that you have been given today. Our surgery scheduler will call you to look at surgery dates and to go over information.   If you have any questions, please call our office.  Laparoscopic Cholecystectomy Laparoscopic cholecystectomy is surgery to remove the gallbladder. The gallbladder is located in the upper right part of the abdomen, behind the liver. It is a storage sac for bile, which is produced in the liver. Bile aids in the digestion and absorption of fats. Cholecystectomy is often done for inflammation of the gallbladder (cholecystitis). This condition is usually caused by a buildup of gallstones (cholelithiasis) in the gallbladder. Gallstones can block the flow of bile, and that can result in inflammation and pain. In severe cases, emergency surgery may be required. If emergency surgery is not required, you will have time to prepare for the procedure. Laparoscopic surgery is an alternative to open surgery. Laparoscopic surgery has a shorter recovery time. Your common bile duct may also need to be examined during the procedure. If stones are found in the common bile duct, they may be removed. LET [facility] CARE PROVIDER KNOW ABOUT:  Any allergies you have.  All medicines you are taking, including vitamins, herbs, eye drops, creams, and over-the-counter medicines.  Previous problems you or members of your family have had  with the use of anesthetics.  Any blood disorders you have.  Previous surgeries you have had.    Any medical conditions you have. RISKS AND COMPLICATIONS Generally, this is a safe procedure. However, problems may occur, including:  Infection.  Bleeding.  Allergic reactions to medicines.  Damage to other structures or organs.  A stone remaining in the common bile duct.  A bile leak from the cyst duct that is clipped when your gallbladder is removed.  The need to convert to open surgery, which requires a larger incision in the abdomen. This may be necessary if your surgeon thinks that it is not safe to continue with a laparoscopic procedure. BEFORE THE PROCEDURE  Ask your health care provider about:  Changing or stopping your regular medicines. This is especially important if you are taking diabetes medicines or blood thinners.  Taking medicines such as aspirin and ibuprofen. These medicines can thin your blood. Do not take these medicines before your procedure if your health care provider instructs you not to.  Follow instructions from your health care provider about eating or drinking restrictions.  Let your health care provider know if you develop a cold or an infection before surgery.  Plan to have someone take you home after the procedure.  Ask your health care provider how your surgical site will be marked or identified.  You may be given antibiotic medicine to help prevent infection. PROCEDURE  To reduce your risk of infection:  Your health care team will wash or sanitize their hands.  Your skin will be washed with soap.  An IV tube  may be inserted into one of your veins.  You will be given a medicine to make you fall asleep (general anesthetic).  A breathing tube will be placed in your mouth.  The surgeon will make several small cuts (incisions) in your abdomen.  A thin, lighted tube (laparoscope) that has a tiny camera on the end will be inserted  through one of the small incisions. The camera on the laparoscope will send a picture to a TV screen (monitor) in the operating room. This will give the surgeon a good view inside your abdomen.  A gas will be pumped into your abdomen. This will expand your abdomen to give the surgeon more room to perform the surgery.  Other tools that are needed for the procedure will be inserted through the other incisions. The gallbladder will be removed through one of the incisions.  After your gallbladder has been removed, the incisions will be closed with stitches (sutures), staples, or skin glue.  Your incisions may be covered with a bandage (dressing). The procedure may vary among health care providers and hospitals. AFTER THE PROCEDURE  Your blood pressure, heart rate, breathing rate, and blood oxygen level will be monitored often until the medicines you were given have worn off.  You will be given medicines as needed to control your pain.   This information is not intended to replace advice given to you by your health care provider. Make sure you discuss any questions you have with your health care provider.   Document Released: 01/04/2005 Document Revised: 09/25/2014 Document Reviewed: 08/16/2012 Elsevier Interactive Patient Education 2016 [place] Diet for Gallbladder Conditions A low-fat diet can be helpful if you have pancreatitis or a gallbladder condition. With these conditions, your pancreas and gallbladder have trouble digesting fats. A healthy eating plan with less fat will help rest your pancreas and gallbladder and reduce your symptoms. WHAT DO I NEED TO KNOW ABOUT THIS DIET?  Eat a low-fat diet.  Reduce your fat intake to less than 20-30% of your total daily calories. This is less than 50-60 g of fat per day.  Remember that you need some fat in your diet. Ask your dietician what your daily goal should be.  Choose nonfat and low-fat healthy foods. Look for the words  "nonfat," "low fat," or "fat free."  As a guide, look on the label and choose foods with less than 3 g of fat per serving. Eat only one serving.  Avoid alcohol.  Do not smoke. If you need help quitting, talk with your health care provider.  Eat small frequent meals instead of three large heavy meals. WHAT FOODS CAN I EAT? Grains Include healthy grains and starches such as potatoes, wheat bread, fiber-rich cereal, and brown rice. Choose whole grain options whenever possible. In adults, whole grains should account for 45-65% of your daily calories.  Fruits and Vegetables Eat plenty of fruits and vegetables. Fresh fruits and vegetables add fiber to your diet. Meats and Other Protein Sources Eat lean meat such as chicken and pork. Trim any fat off of meat before cooking it. Eggs, fish, and beans are other sources of protein. In adults, these foods should account for 10-35% of your daily calories. Dairy Choose low-fat milk and dairy options. Dairy includes fat and protein, as well as calcium.  Fats and Oils Limit high-fat foods such as fried foods, sweets, baked goods, sugary drinks.  Other Creamy sauces and condiments, such as mayonnaise, can add extra fat. Think  about whether or not you need to use them, or use smaller amounts or low fat options. WHAT FOODS ARE NOT RECOMMENDED?  High fat foods, such as:  [employer].  Ice cream.  [place] toast.  Sweet rolls.  Pizza.  Cheese bread.  Foods covered with batter, butter, creamy sauces, or cheese.  Fried foods.  Sugary drinks and desserts.  Foods that cause gas or bloating   This information is not intended to replace advice given to you by your health care provider. Make sure you discuss any questions you have with your health care provider.   Document Released: 01/09/2013 Document Reviewed: 01/09/2013 Elsevier Interactive Patient Education [employer].

## 2020-06-06 ENCOUNTER — Encounter
Admission: RE | Admit: 2020-06-06 | Discharge: 2020-06-06 | Disposition: A | Discharge status: Home / Self Care | Payer: BC Managed Care – PPO | Source: Ambulatory Visit | Attending: General Surgery | Admitting: General Surgery

## 2020-06-06 ENCOUNTER — Other Ambulatory Visit: Payer: Self-pay

## 2020-06-06 NOTE — Patient Instructions (Addendum)
INSTRUCTIONS FOR SURGERY     Your surgery is scheduled for:   Monday, MAY 23RD     To find out your arrival time for the day of surgery,          please call [phone] between 1 pm and 3 pm on : Friday, MAY 20TH     When you arrive for surgery, report to THE REGISTRATION DESK ON THE FIRST FLOOR OF  THE MEDICAL MALL. ONCE THEY HAVE FINISHED THEIR PROCESS, PROCEED TO THE SECOND FLOOR AND SIGN IN AT THE SURGERY DESK.    REMEMBER: Instructions that are not followed completely may result in serious medical risk,  up to and including death, or upon the discretion of your surgeon and anesthesiologist,            your surgery may need to be rescheduled.  __X__ 1. Do not eat food after midnight the night before your procedure.                    No gum, candy, lozenger, tic tacs, tums or hard candies.                  ABSOLUTELY NOTHING SOLID IN YOUR MOUTH AFTER MIDNIGHT                    You may drink unlimited clear liquids up to 2 hours before you are scheduled to arrive for surgery.                   Do not drink anything within those 2 hours unless you need to take medicine, then take the                   smallest amount you need.  Clear liquids include:  water, apple juice without pulp,                   any flavor Gatorade, Black coffee, black tea.  Sugar may be added but no dairy/ honey /lemon.                        Broth and jello is not considered a clear liquid.  __x__  2. On the morning of surgery, please brush your teeth with toothpaste and water. You may rinse with                  mouthwash if you wish but DO NOT SWALLOW TOOTHPASTE OR MOUTHWASH  __X___3. NO alcohol for 24 hours before or after surgery.  __x___ 4.  Do NOT smoke or use e-cigarettes for 24 HOURS PRIOR TO SURGERY.                      DO NOT Use any chewable tobacco products for at least 6 hours prior to surgery.  __x___ 5. If you start any new  medication after this appointment and prior to surgery, please                   Bring it with you on the day of surgery.  ___x__ 6. Notify your  doctor if there is any change in your medical condition, such as fever,                   infection, vomitting, diarrhea or any open sores.  __x___ 7.  USE ANTIBACTERIAL SOAP as instructed, the night before surgery and the day of surgery.                   Once you have washed with this soap, do NOT use any of the following: Powders, perfumes                    or lotions. Please do not wear make up, hairpins, clips or nail polish. You MAY wear deodorant.                                                             Men may shave their face and neck.                     DO NOT wear ANY jewelry on the day of surgery. If there are rings that are too tight to                    remove easily, please address this prior to the surgery day. Piercings need to be removed.                                                                     NO METAL ON YOUR BODY.                    Do NOT bring any valuables.  If you came to Pre-Admit testing then you will not need license,                     insurance card or credit card.  If you will be staying overnight, please either leave your things in                     the car or have your family be responsible for these items.                     [facility] IS NOT RESPONSIBLE FOR BELONGINGS OR VALUABLES.  ___X__ 8. DO NOT wear contact lenses on surgery day.  You may not have dentures,                     Hearing aides, contacts or glasses in the operating room. These items can be                    Placed in the Recovery Room to receive immediately after surgery.  __x___ 9. IF YOU ARE SCHEDULED TO GO HOME ON THE SAME DAY, YOU MUST                   Have someone to drive you home and to stay with you  for  the first 24 hours.                    Have an arrangement prior to arriving on surgery day.  ___x__ 10. Take  the following medications on the morning of surgery with a sip of water:                              1. nothing                     2.                      _____ 11.  Follow any instructions provided to you by your surgeon.                        Such as enema, clear liquid bowel prep  __X__  12. STOP ALL ASPIRIN PRODUCTS AS OF TODAY, MAY 20TH                       THIS INCLUDES BC POWDERS / GOODIES POWDER  __x___ 13. STOP Anti-inflammatories as of TODAY, MAY 20TH                      This includes IBUPROFEN / MOTRIN / ADVIL / ALEVE/ NAPROXYN                    YOU MAY TAKE TYLENOL ANY TIME PRIOR TO SURGERY.  _____ 14.  Stop supplements until after surgery.              _X_____18. Wear clean and comfortable clothing to the hospital. WEAR LOOSE FITTING PANTS.  HAVE STOOL SOFTENERS AVAILABLE AT HOME TO USE AFTER SURGERY.  BRING PHONE NUMBERS FOR YOUR CONTACT PEOPLE.  MAKE SURE YOU AND YOUR TRANSPORT PERSON HAVE MASKS.  GOOD LUCK!!

## 2020-06-09 ENCOUNTER — Ambulatory Visit: Payer: BC Managed Care – PPO | Admitting: Anesthesiology

## 2020-06-09 ENCOUNTER — Ambulatory Visit
Admission: RE | Admit: 2020-06-09 | Discharge: 2020-06-09 | Disposition: A | Discharge status: Home / Self Care | Payer: BC Managed Care – PPO | Attending: General Surgery | Admitting: General Surgery

## 2020-06-09 ENCOUNTER — Encounter: Payer: Self-pay | Admitting: General Surgery

## 2020-06-09 ENCOUNTER — Encounter
Admission: RE | Disposition: A | Discharge status: Home / Self Care | Payer: Self-pay | Source: Home / Self Care | Attending: General Surgery

## 2020-06-09 DIAGNOSIS — D649 Anemia, unspecified: Secondary | ICD-10-CM | POA: Diagnosis not present

## 2020-06-09 DIAGNOSIS — K8010 Calculus of gallbladder with chronic cholecystitis without obstruction: Secondary | ICD-10-CM | POA: Diagnosis present

## 2020-06-09 DIAGNOSIS — K760 Fatty (change of) liver, not elsewhere classified: Secondary | ICD-10-CM | POA: Insufficient documentation

## 2020-06-09 DIAGNOSIS — K8020 Calculus of gallbladder without cholecystitis without obstruction: Secondary | ICD-10-CM

## 2020-06-09 SURGERY — CHOLECYSTECTOMY, ROBOT-ASSISTED, LAPAROSCOPIC
Anesthesia: General

## 2020-06-09 MED ORDER — ACETAMINOPHEN 500 MG PO TABS: 1000.0000 mg | ORAL_TABLET | ORAL | Status: AC

## 2020-06-09 MED ORDER — FENTANYL CITRATE (PF) 100 MCG/2ML IJ SOLN
25.0000 ug | INTRAMUSCULAR | Status: DC | PRN
Start: 2020-06-09 — End: 2020-06-09
  Administered 2020-06-09 (×3): 25 ug via INTRAVENOUS

## 2020-06-09 MED ORDER — GABAPENTIN 300 MG PO CAPS
300.0000 mg | ORAL_CAPSULE | ORAL | Status: AC
Start: 2020-06-09 — End: 2020-06-09

## 2020-06-09 MED ORDER — FENTANYL CITRATE (PF) 100 MCG/2ML IJ SOLN
INTRAMUSCULAR | Status: DC | PRN
  Administered 2020-06-09 (×2): 50 ug via INTRAVENOUS
  Administered 2020-06-09: 25 ug via INTRAVENOUS
  Administered 2020-06-09: 50 ug via INTRAVENOUS
  Administered 2020-06-09: 25 ug via INTRAVENOUS

## 2020-06-09 MED ORDER — LIDOCAINE-EPINEPHRINE 1 %-1:100000 IJ SOLN
INTRAMUSCULAR | Status: DC | PRN
  Administered 2020-06-09: 28 mL via INTRAMUSCULAR

## 2020-06-09 MED ORDER — EPHEDRINE SULFATE 50 MG/ML IJ SOLN
INTRAMUSCULAR | Status: DC | PRN
  Administered 2020-06-09: 10 mg via INTRAVENOUS

## 2020-06-09 MED ORDER — OXYCODONE HCL 5 MG PO TABS: 5.0000 mg | ORAL_TABLET | Freq: Four times a day (QID) | ORAL | 0 refills | Status: DC | PRN

## 2020-06-09 MED ORDER — MIDAZOLAM HCL 2 MG/2ML IJ SOLN
INTRAMUSCULAR | Status: AC
  Filled 2020-06-09: qty 2

## 2020-06-09 MED ORDER — FAMOTIDINE 20 MG PO TABS
ORAL_TABLET | ORAL | Status: AC
  Administered 2020-06-09: 20 mg via ORAL
  Filled 2020-06-09: qty 1

## 2020-06-09 MED ORDER — ONDANSETRON HCL 4 MG/2ML IJ SOLN
INTRAMUSCULAR | Status: DC | PRN
  Administered 2020-06-09 (×2): 4 mg via INTRAVENOUS

## 2020-06-09 MED ORDER — PHENYLEPHRINE HCL (PRESSORS) 10 MG/ML IV SOLN
INTRAVENOUS | Status: DC | PRN
  Administered 2020-06-09 (×2): 100 ug via INTRAVENOUS

## 2020-06-09 MED ORDER — LIDOCAINE-EPINEPHRINE 1 %-1:100000 IJ SOLN
INTRAMUSCULAR | Status: AC
  Filled 2020-06-09: qty 1

## 2020-06-09 MED ORDER — PROPOFOL 10 MG/ML IV BOLUS
INTRAVENOUS | Status: DC | PRN
  Administered 2020-06-09: 200 mg via INTRAVENOUS

## 2020-06-09 MED ORDER — CELECOXIB 200 MG PO CAPS
ORAL_CAPSULE | ORAL | Status: AC
  Administered 2020-06-09: 200 mg via ORAL
  Filled 2020-06-09: qty 1

## 2020-06-09 MED ORDER — ACETAMINOPHEN 500 MG PO TABS: 1000.0000 mg | ORAL_TABLET | Freq: Four times a day (QID) | ORAL | 0 refills | Status: AC

## 2020-06-09 MED ORDER — SUGAMMADEX SODIUM 500 MG/5ML IV SOLN
INTRAVENOUS | Status: DC | PRN
  Administered 2020-06-09: 250 mg via INTRAVENOUS

## 2020-06-09 MED ORDER — CHLORHEXIDINE GLUCONATE 0.12 % MT SOLN: 15.0000 mL | Freq: Once | OROMUCOSAL | Status: AC

## 2020-06-09 MED ORDER — LIDOCAINE HCL (CARDIAC) PF 100 MG/5ML IV SOSY
PREFILLED_SYRINGE | INTRAVENOUS | Status: DC | PRN
  Administered 2020-06-09: 100 mg via INTRAVENOUS

## 2020-06-09 MED ORDER — INDOCYANINE GREEN 25 MG IV SOLR
7.5000 mg | Freq: Once | INTRAVENOUS | Status: AC
  Administered 2020-06-09: 7.5 mg via INTRAVENOUS
  Filled 2020-06-09: qty 3

## 2020-06-09 MED ORDER — CHLORHEXIDINE GLUCONATE 0.12 % MT SOLN
OROMUCOSAL | Status: AC
  Administered 2020-06-09: 15 mL via OROMUCOSAL
  Filled 2020-06-09: qty 15

## 2020-06-09 MED ORDER — DEXMEDETOMIDINE (PRECEDEX) IN NS 20 MCG/5ML (4 MCG/ML) IV SYRINGE
PREFILLED_SYRINGE | INTRAVENOUS | Status: DC | PRN
  Administered 2020-06-09: 8 ug via INTRAVENOUS
  Administered 2020-06-09: 12 ug via INTRAVENOUS

## 2020-06-09 MED ORDER — FENTANYL CITRATE (PF) 100 MCG/2ML IJ SOLN
INTRAMUSCULAR | Status: AC
  Filled 2020-06-09: qty 2

## 2020-06-09 MED ORDER — GLYCOPYRROLATE 0.2 MG/ML IJ SOLN
INTRAMUSCULAR | Status: DC | PRN
  Administered 2020-06-09: .2 mg via INTRAVENOUS

## 2020-06-09 MED ORDER — SODIUM CHLORIDE 0.9 % IV SOLN
2.0000 g | INTRAVENOUS | Status: AC
  Administered 2020-06-09: 2 g via INTRAVENOUS

## 2020-06-09 MED ORDER — ROCURONIUM BROMIDE 100 MG/10ML IV SOLN
INTRAVENOUS | Status: DC | PRN
  Administered 2020-06-09: 20 mg via INTRAVENOUS
  Administered 2020-06-09: 50 mg via INTRAVENOUS

## 2020-06-09 MED ORDER — CELECOXIB 200 MG PO CAPS: 200.0000 mg | ORAL_CAPSULE | ORAL | Status: AC

## 2020-06-09 MED ORDER — LACTATED RINGERS IV SOLN: INTRAVENOUS | Status: DC

## 2020-06-09 MED ORDER — DEXAMETHASONE SODIUM PHOSPHATE 10 MG/ML IJ SOLN
INTRAMUSCULAR | Status: DC | PRN
  Administered 2020-06-09: 10 mg via INTRAVENOUS

## 2020-06-09 MED ORDER — IBUPROFEN 800 MG PO TABS: 800.0000 mg | ORAL_TABLET | Freq: Three times a day (TID) | ORAL | 0 refills | Status: DC | PRN

## 2020-06-09 MED ORDER — ACETAMINOPHEN 10 MG/ML IV SOLN
INTRAVENOUS | Status: AC
  Filled 2020-06-09: qty 100

## 2020-06-09 MED ORDER — FENTANYL CITRATE (PF) 100 MCG/2ML IJ SOLN
INTRAMUSCULAR | Status: AC
  Administered 2020-06-09: 25 ug via INTRAVENOUS
  Filled 2020-06-09: qty 2

## 2020-06-09 MED ORDER — SODIUM CHLORIDE 0.9 % IV SOLN
INTRAVENOUS | Status: AC
  Filled 2020-06-09: qty 2

## 2020-06-09 MED ORDER — CHLORHEXIDINE GLUCONATE CLOTH 2 % EX PADS
6.0000 | MEDICATED_PAD | Freq: Once | CUTANEOUS | Status: AC
  Administered 2020-06-09: 6 via TOPICAL

## 2020-06-09 MED ORDER — FAMOTIDINE 20 MG PO TABS: 20.0000 mg | ORAL_TABLET | Freq: Once | ORAL | Status: AC

## 2020-06-09 MED ORDER — MIDAZOLAM HCL 2 MG/2ML IJ SOLN
INTRAMUSCULAR | Status: DC | PRN
  Administered 2020-06-09: 2 mg via INTRAVENOUS

## 2020-06-09 MED ORDER — GABAPENTIN 300 MG PO CAPS
ORAL_CAPSULE | ORAL | Status: AC
  Administered 2020-06-09: 300 mg via ORAL
  Filled 2020-06-09: qty 1

## 2020-06-09 MED ORDER — ONDANSETRON HCL 4 MG/2ML IJ SOLN: 4.0000 mg | Freq: Once | INTRAMUSCULAR | Status: DC | PRN

## 2020-06-09 MED ORDER — CHLORHEXIDINE GLUCONATE CLOTH 2 % EX PADS: 6.0000 | MEDICATED_PAD | Freq: Once | CUTANEOUS | Status: DC

## 2020-06-09 MED ORDER — ORAL CARE MOUTH RINSE: 15.0000 mL | Freq: Once | OROMUCOSAL | Status: AC

## 2020-06-09 MED ORDER — BUPIVACAINE HCL (PF) 0.25 % IJ SOLN
INTRAMUSCULAR | Status: AC
  Filled 2020-06-09: qty 30

## 2020-06-09 MED ORDER — ACETAMINOPHEN 500 MG PO TABS
ORAL_TABLET | ORAL | Status: AC
  Administered 2020-06-09: 1000 mg via ORAL
  Filled 2020-06-09: qty 2

## 2020-06-09 SURGICAL SUPPLY — 63 items
"PENCIL ELECTRO HAND CTR " (MISCELLANEOUS) ×1 IMPLANT
ADH SKN CLS APL DERMABOND .7 (GAUZE/BANDAGES/DRESSINGS) ×1
APL PRP STRL LF DISP 70% ISPRP (MISCELLANEOUS) ×1
BAG INFUSER PRESSURE 100CC (MISCELLANEOUS) ×2 IMPLANT
BAG SPEC RTRVL LRG 6X4 10 (ENDOMECHANICALS) ×1
BLADE SURG SZ11 CARB STEEL (BLADE) ×2 IMPLANT
CANISTER SUCT 1200ML W/VALVE (MISCELLANEOUS) ×2 IMPLANT
CANNULA REDUC XI 12-8 STAPL (CANNULA) ×1
CANNULA REDUCER 12-8 DVNC XI (CANNULA) ×1 IMPLANT
CHLORAPREP W/TINT 26 (MISCELLANEOUS) ×2 IMPLANT
CLIP VESOLOCK MED LG 6/CT (CLIP) ×2 IMPLANT
COVER TIP SHEARS 8 DVNC (MISCELLANEOUS) ×1 IMPLANT
COVER TIP SHEARS 8MM DA VINCI (MISCELLANEOUS) ×1
COVER WAND RF STERILE (DRAPES) ×2 IMPLANT
DECANTER SPIKE VIAL GLASS SM (MISCELLANEOUS) ×4 IMPLANT
DEFOGGER SCOPE WARMER CLEARIFY (MISCELLANEOUS) ×2 IMPLANT
DERMABOND ADVANCED (GAUZE/BANDAGES/DRESSINGS) ×1
DERMABOND ADVANCED .7 DNX12 (GAUZE/BANDAGES/DRESSINGS) ×1 IMPLANT
DRAPE ARM DVNC X/XI (DISPOSABLE) ×4 IMPLANT
DRAPE COLUMN DVNC XI (DISPOSABLE) ×1 IMPLANT
DRAPE DA VINCI XI ARM (DISPOSABLE) ×4
DRAPE DA VINCI XI COLUMN (DISPOSABLE) ×1
ELECT CAUTERY BLADE TIP 2.5 (TIP) ×2
ELECT REM PT RETURN 9FT ADLT (ELECTROSURGICAL) ×2
ELECTRODE CAUTERY BLDE TIP 2.5 (TIP) ×1 IMPLANT
ELECTRODE REM PT RTRN 9FT ADLT (ELECTROSURGICAL) ×1 IMPLANT
GLOVE SURG ENC MOIS LTX SZ6.5 (GLOVE) ×4 IMPLANT
GLOVE SURG UNDER LTX SZ7 (GLOVE) ×4 IMPLANT
GOWN STRL REUS W/ TWL LRG LVL3 (GOWN DISPOSABLE) ×3 IMPLANT
GOWN STRL REUS W/TWL LRG LVL3 (GOWN DISPOSABLE) ×6
GRASPER SUT TROCAR 14GX15 (MISCELLANEOUS) ×1 IMPLANT
IRRIGATOR SUCT 8 DISP DVNC XI (IRRIGATION / IRRIGATOR) IMPLANT
IRRIGATOR SUCTION 8MM XI DISP (IRRIGATION / IRRIGATOR) ×1
IV NS 1000ML (IV SOLUTION) ×2
IV NS 1000ML BAXH (IV SOLUTION) IMPLANT
KIT PINK PAD W/HEAD ARE REST (MISCELLANEOUS) ×2
KIT PINK PAD W/HEAD ARM REST (MISCELLANEOUS) ×1 IMPLANT
LABEL OR SOLS (LABEL) ×2 IMPLANT
MANIFOLD NEPTUNE II (INSTRUMENTS) ×2 IMPLANT
NDL INSUFFLATION 14GA 120MM (NEEDLE) IMPLANT
NEEDLE HYPO 22GX1.5 SAFETY (NEEDLE) ×2 IMPLANT
NEEDLE INSUFFLATION 14GA 120MM (NEEDLE) ×2 IMPLANT
NS IRRIG 500ML POUR BTL (IV SOLUTION) ×2 IMPLANT
OBTURATOR OPTICAL STANDARD 8MM (TROCAR) ×1
OBTURATOR OPTICAL STND 8 DVNC (TROCAR) ×1
OBTURATOR OPTICALSTD 8 DVNC (TROCAR) ×1 IMPLANT
PACK LAP CHOLECYSTECTOMY (MISCELLANEOUS) ×2 IMPLANT
PENCIL ELECTRO HAND CTR (MISCELLANEOUS) ×2 IMPLANT
POUCH SPECIMEN RETRIEVAL 10MM (ENDOMECHANICALS) ×2 IMPLANT
SEAL CANN UNIV 5-8 DVNC XI (MISCELLANEOUS) ×3 IMPLANT
SEAL XI 5MM-8MM UNIVERSAL (MISCELLANEOUS) ×3
SET TUBE SMOKE EVAC HIGH FLOW (TUBING) ×2 IMPLANT
SOLUTION ELECTROLUBE (MISCELLANEOUS) ×2 IMPLANT
STAPLER CANNULA SEAL DVNC XI (STAPLE) ×1 IMPLANT
STAPLER CANNULA SEAL XI (STAPLE) ×1
STRIP CLOSURE SKIN 1/2X4 (GAUZE/BANDAGES/DRESSINGS) ×2 IMPLANT
SUT MNCRL 4-0 (SUTURE) ×2
SUT MNCRL 4-0 27XMFL (SUTURE) ×1
SUT VIC AB 3-0 SH 27 (SUTURE) ×4
SUT VIC AB 3-0 SH 27X BRD (SUTURE) IMPLANT
SUT VICRYL 0 AB UR-6 (SUTURE) IMPLANT
SUTURE MNCRL 4-0 27XMF (SUTURE) ×1 IMPLANT
TROCAR XCEL NON-BLD 5MMX100MML (ENDOMECHANICALS) IMPLANT

## 2020-06-09 NOTE — Anesthesia Preprocedure Evaluation (Signed)
Anesthesia Evaluation  Patient identified by MRN, date of birth, ID band Patient awake    Reviewed: Allergy & Precautions, NPO status , Patient's Chart, lab work & pertinent test results  History of Anesthesia Complications Negative for: history of anesthetic complications  Airway Mallampati: II       Dental   Pulmonary neg sleep apnea, neg COPD, Not current smoker,           Cardiovascular hypertension, Pt. on medications (-) Past MI and (-) CHF (-) dysrhythmias (-) Valvular Problems/Murmurs     Neuro/Psych neg Seizures Depression    GI/Hepatic Neg liver ROS, neg GERD  ,  Endo/Other  neg diabetes  Renal/GU negative Renal ROS     Musculoskeletal   Abdominal   Peds  Hematology   Anesthesia Other Findings   Reproductive/Obstetrics                             Anesthesia Physical Anesthesia Plan  ASA: II  Anesthesia Plan: General   Post-op Pain Management:    Induction: Intravenous  PONV Risk Score and Plan: 2 and Ondansetron and Dexamethasone  Airway Management Planned: Oral ETT  Additional Equipment:   Intra-op Plan:   Post-operative Plan:   Informed Consent: I have reviewed the patients History and Physical, chart, labs and discussed the procedure including the risks, benefits and alternatives for the proposed anesthesia with the patient or authorized representative who has indicated his/her understanding and acceptance.       Plan Discussed with:   Anesthesia Plan Comments:         Anesthesia Quick Evaluation

## 2020-06-09 NOTE — Anesthesia Procedure Notes (Signed)
Procedure Name: Intubation Performed by: Fletcher-Harrison, Teng Decou, CRNA Pre-anesthesia Checklist: Patient identified, Emergency Drugs available, Suction available and Patient being monitored Patient Re-evaluated:Patient Re-evaluated prior to induction Oxygen Delivery Method: Circle system utilized Preoxygenation: Pre-oxygenation with 100% oxygen Induction Type: IV induction Ventilation: Mask ventilation without difficulty Laryngoscope Size: McGraph and 3 Grade View: Grade I Tube type: Oral Number of attempts: 1 Airway Equipment and Method: Stylet and Oral airway Placement Confirmation: ETT inserted through vocal cords under direct vision,  positive ETCO2,  breath sounds checked- equal and bilateral and CO2 detector Secured at: 21 cm Tube secured with: Tape Dental Injury: Teeth and Oropharynx as per pre-operative assessment        

## 2020-06-09 NOTE — Anesthesia Postprocedure Evaluation (Signed)
Anesthesia Post Note  Patient: Darren Perry  Procedure(s) Performed: XI ROBOTIC ASSISTED LAPAROSCOPIC CHOLECYSTECTOMY (N/A )  Patient location during evaluation: PACU Anesthesia Type: General Level of consciousness: awake and alert Pain management: pain level controlled Vital Signs Assessment: post-procedure vital signs reviewed and stable Respiratory status: spontaneous breathing and respiratory function stable Cardiovascular status: stable Anesthetic complications: no   No complications documented.   Last Vitals:  Vitals:   06/09/20 1300 06/09/20 1316  BP: 117/83 130/77  Pulse: 84 70  Resp: 15 16  Temp: 36.6 C (!) 36.3 C  SpO2: 96% 97%    Last Pain:  Vitals:   06/09/20 1316  TempSrc: Temporal  PainSc: 0-No pain                 Rafiel Mecca K

## 2020-06-09 NOTE — Interval H&P Note (Signed)
History and Physical Interval Note:  06/09/2020 9:47 AM  Darren Perry  has presented today for surgery, with the diagnosis of symptomatic cholelithiasis.  The various methods of treatment have been discussed with the patient and family. After consideration of risks, benefits and other options for treatment, the patient has consented to  Procedure(s): XI ROBOTIC ASSISTED LAPAROSCOPIC CHOLECYSTECTOMY (N/A) as a surgical intervention.  The patient's history has been reviewed, patient examined, no change in status, stable for surgery.  I have reviewed the patient's chart and labs.  Questions were answered to the patient's satisfaction.     Duanne Guess

## 2020-06-09 NOTE — Discharge Instructions (Signed)

## 2020-06-09 NOTE — Transfer of Care (Signed)
Immediate Anesthesia Transfer of Care Note  Patient: Darren Perry  Procedure(s) Performed: XI ROBOTIC ASSISTED LAPAROSCOPIC CHOLECYSTECTOMY (N/A )  Patient Location: PACU  Anesthesia Type:General  Level of Consciousness: awake, drowsy and patient cooperative  Airway & Oxygen Therapy: Patient Spontanous Breathing  Post-op Assessment: Report given to RN and Post -op Vital signs reviewed and stable  Post vital signs: Reviewed and stable  Last Vitals:  Vitals Value Taken Time  BP 150/89 06/09/20 1149  Temp    Pulse 76 06/09/20 1152  Resp 19 06/09/20 1152  SpO2 97 % 06/09/20 1152  Vitals shown include unvalidated device data.  Last Pain:  Vitals:   06/09/20 0734  TempSrc: Temporal  PainSc: 3          Complications: No complications documented.

## 2020-06-09 NOTE — Op Note (Signed)
Operative note  Pre-operative Diagnosis: Symptomatic cholelithiasis  Post-operative Diagnosis: Same  Procedure: Robot assisted laparoscopic cholecystectomy  Surgeon: Duanne Guess, MD  Anesthesia: GETA  Findings: Normal biliary and vascular anatomy.  Thin-walled gallbladder with multiple stones.  No inflammation to suggest acute cholecystitis.  Estimated Blood Loss: Less than 5 cc       Specimens: Gallbladder           Complications: none immediately apparent  Procedure In Detail: The patient was seen again in the holding room. The benefits, complications, treatment options, and expected outcomes were discussed with the patient. The risks of bleeding, infection, recurrence of symptoms, failure to resolve symptoms, bile duct damage, bile duct leak, retained common bile duct stone, bowel injury, any of which could require further surgery and/or ERCP, stent, or papillotomy were reviewed with the patient. The likelihood of improving the patient's symptoms with return to their baseline status is good.  The patient and/or family concurred with the proposed plan, giving informed consent.  The patient was taken to operating room, identified and the procedure verified as laparoscopic cholecystectomy.  A time out was held and the above information confirmed.  Prior to the induction of general anesthesia, antibiotic prophylaxis was administered. VTE prophylaxis was in place. General endotracheal anesthesia was then administered and tolerated well. After induction, the abdomen was prepped with Chloraprep and draped in standard sterile fashion. The patient was positioned in the supine position.  Optiview technique was used to enter the abdomen in the right upper quadrant via a standard 5 mm laparoscopic trocar.  Pneumoperitoneum was then created with CO2 and tolerated well without any adverse changes in the patient's vital signs.  A 12 mm robotic trocar along with three 8-mm robotic trochars were  placed under direct vision.  All skin incisions were infiltrated with a local anesthetic agent before making the incision and placing the trocars.   The patient was positioned in 15 degrees of reverse Trendelenburg and tilted 10 degrees to the left.  The robot was brought to the surgical field and docked in the standard fashion.  We made sure all the instrumentation was kept in direct view at all times and that there were no collision between the arms.  I scrubbed out and went to the console.  The gallbladder was identified, the fundus grasped and retracted cephalad. Adhesions were lysed bluntly and with electrocautery. The infundibulum was grasped and retracted laterally, exposing the peritoneum overlying the triangle of Calot. This was then divided and exposed in a blunt fashion. An extended critical view of the cystic duct and cystic artery was obtained.  The cystic duct was clearly identified and bluntly dissected away from the surrounding tissues, as was the cystic artery.  Using ICG cholangiography we visualized the cystic duct and confirmed that there was no aberrant biliary ductal anatomy nor any evidence of bile duct injury.  Both cystic duct and cystic artery were clipped and divided. The gallbladder was taken from the gallbladder fossa in a retrograde fashion with the electrocautery. Hemostasis was achieved with the electrocautery. Inspection of the right upper quadrant was performed. No bleeding, bile duct injury or leak, or bowel injury was noted.  The gallbladder was then placed in an Endopouch bag. The robotic instruments were removed and robotic arms were undocked in the standard fashion.    I scrubbed back in.  The gallbladder was removed via the 12 mm trocar site.  I had skived the trocar as I entered the abdomen, and the fascial  defects did not line up, therefore I did not close the fascia at this site.  The remaining 8 mm ports were removed and pneumoperitoneum was released.  Each port  site was closed with deep dermal 3-0 Vicryl.  4-0 subcuticular Monocryl was used to close the skin. Dermabond was applied, followed by Steri-Strips.  The patient was then awakened, extubated, and taken to the postanesthesia recovery unit in stable condition.   Sponge, lap, and needle counts were reported to be correct number at closure and at the conclusion of the case.          Duanne Guess, MD FACS

## 2020-06-11 LAB — SURGICAL PATHOLOGY

## 2020-06-20 ENCOUNTER — Telehealth: Payer: Self-pay | Admitting: *Deleted

## 2020-06-20 NOTE — Telephone Encounter (Signed)
Faxed FMLA to [employer] at [phone] 

## 2020-06-26 ENCOUNTER — Ambulatory Visit (INDEPENDENT_AMBULATORY_CARE_PROVIDER_SITE_OTHER): Payer: BC Managed Care – PPO | Admitting: General Surgery

## 2020-06-26 ENCOUNTER — Telehealth: Payer: Self-pay | Admitting: *Deleted

## 2020-06-26 ENCOUNTER — Encounter: Payer: Self-pay | Admitting: General Surgery

## 2020-06-26 ENCOUNTER — Other Ambulatory Visit: Payer: Self-pay

## 2020-06-26 VITALS — BP 137/95 | HR 86 | Temp 98.3°F | Ht 68.0 in | Wt 228.0 lb

## 2020-06-26 DIAGNOSIS — Z9049 Acquired absence of other specified parts of digestive tract: Secondary | ICD-10-CM

## 2020-06-26 NOTE — Patient Instructions (Addendum)
Follow-up with our office as needed.  Please call and ask to speak with a nurse if you develop questions or concerns.   GENERAL POST-OPERATIVE PATIENT INSTRUCTIONS   WOUND CARE INSTRUCTIONS: Try to keep the wound dry and avoid ointments on the wound unless directed to do so.  If the wound becomes bright red and painful or starts to drain infected material that is not clear, please contact your physician immediately.  If the wound is mildly pink and has a thick firm ridge underneath it, this is normal, and is referred to as a healing ridge.  This will resolve over the next 4-6 weeks.  BATHING: You may shower if you have been informed of this by your surgeon. However, Please do not submerge in a tub, hot tub, or pool until incisions are completely sealed or have been told by your surgeon that you may do so.  DIET:  You may eat any foods that you can tolerate.  It is a good idea to eat a high fiber diet and take in plenty of fluids to prevent constipation.  If you do become constipated you may want to take a mild laxative or take ducolax tablets on a daily basis until your bowel habits are regular.  Constipation can be very uncomfortable, along with straining, after recent surgery.  ACTIVITY: You may want to hug a pillow when coughing and sneezing to add additional support to the surgical area, if you had abdominal or chest surgery, which will decrease pain during these times.  You are encouraged to walk and engage in light activity for the next two weeks.  You should not lift more than 20 pounds for 4 weeks after surgery as it could put you at increased risk for complications.  Twenty pounds is roughly equivalent to a plastic bag of groceries. At that time- Listen to your body when lifting, if you have pain when lifting, stop and then try again in a few days. Soreness after doing exercises or activities of daily living is normal as you get back in to your normal routine.  MEDICATIONS:  Try to take  narcotic medications and anti-inflammatory medications, such as tylenol, ibuprofen, naprosyn, etc., with food.  This will minimize stomach upset from the medication.  Should you develop nausea and vomiting from the pain medication, or develop a rash, please discontinue the medication and contact your physician.  You should not drive, make important decisions, or operate machinery when taking narcotic pain medication.  SUNBLOCK Use sun block to incision area over the next year if this area will be exposed to sun. This helps decrease scarring and will allow you avoid a permanent darkened area over your incision.  QUESTIONS:  Please feel free to call our office if you have any questions, and we will be glad to assist you.

## 2020-06-26 NOTE — Telephone Encounter (Signed)
Faxed updated FMLA to [facility] at [phone]

## 2020-06-26 NOTE — Progress Notes (Signed)
Darren Perry is here today for a postoperative visit.  He is a 45 year old man who underwent a robot-assisted laparoscopic cholecystectomy on Jun 09, 2020.  He reports that he has done well since his surgery.  His appetite is a little bit diminished, but he has been eating well.  He has had an occasional loose bowel movement, but denies frank diarrhea.  No issues with pain control.  No fevers or chills, no nausea or vomiting.  Today's Vitals   06/26/20 0851  BP: (!) 137/95  Pulse: 86  Temp: 98.3 F (36.8 C)  SpO2: 99%  Weight: 228 lb (103.4 kg)  Height: 5\' 8"  (1.727 m)   Body mass index is 34.67 kg/m.  Focused abdominal exam: Laparoscopic port sites are healing nicely.  There is no erythema, induration, or drainage present.  Impression and plan: This is a 45 year old man status post laparoscopic cholecystectomy.  He is doing well.  He was advised that the loose stools should clear up shortly, but that if he needed to, he may use over-the-counter agents such as Pepto-Bismol or Imodium for temporary relief.  He may resume all of his usual activities and I will see him on an as-needed basis.

## 2020-08-24 ENCOUNTER — Encounter: Payer: Self-pay | Admitting: Emergency Medicine

## 2020-08-24 ENCOUNTER — Ambulatory Visit
Admission: EM | Admit: 2020-08-24 | Discharge: 2020-08-24 | Disposition: A | Discharge status: Home / Self Care | Payer: BC Managed Care – PPO | Attending: Sports Medicine | Admitting: Sports Medicine

## 2020-08-24 ENCOUNTER — Other Ambulatory Visit: Payer: Self-pay

## 2020-08-24 DIAGNOSIS — R0981 Nasal congestion: Secondary | ICD-10-CM

## 2020-08-24 DIAGNOSIS — R0989 Other specified symptoms and signs involving the circulatory and respiratory systems: Secondary | ICD-10-CM

## 2020-08-24 DIAGNOSIS — R21 Rash and other nonspecific skin eruption: Secondary | ICD-10-CM

## 2020-08-24 DIAGNOSIS — R059 Cough, unspecified: Secondary | ICD-10-CM

## 2020-08-24 DIAGNOSIS — B356 Tinea cruris: Secondary | ICD-10-CM

## 2020-08-24 MED ORDER — PROMETHAZINE-DM 6.25-15 MG/5ML PO SYRP: 5.0000 mL | ORAL_SOLUTION | Freq: Four times a day (QID) | ORAL | 0 refills | Status: DC | PRN

## 2020-08-24 MED ORDER — FLUCONAZOLE 150 MG PO TABS
150.0000 mg | ORAL_TABLET | ORAL | 0 refills | Status: DC
Start: 2020-08-24 — End: 2022-06-17

## 2020-08-24 MED ORDER — CLOTRIMAZOLE 1 % EX CREA: TOPICAL_CREAM | CUTANEOUS | 0 refills | Status: DC

## 2020-08-24 MED ORDER — FLUTICASONE PROPIONATE 50 MCG/ACT NA SUSP: 2.0000 | Freq: Every day | NASAL | 0 refills | Status: DC

## 2020-08-24 NOTE — ED Triage Notes (Signed)
Patient c/o cough and chest congestion for 2 weeks.  Patient also reports itchy rash on his genitals that started about 2 weeks ago.  Patient denies fevers.

## 2020-08-24 NOTE — Discharge Instructions (Addendum)
As we discussed, I am treating with a topical lotion for your rash as well as an oral medication.  Please take as prescribed. I prescribed a medication for your cough. I also prescribed a medication for your nasal congestion. Please consider purchasing Prilosec OTC and take it for a month in case you are micro refluxing at night when your cough seems to be worse. Please see educational handouts. If symptoms worsen please see her primary care physician.

## 2020-08-24 NOTE — ED Provider Notes (Signed)
[facility]    CSN: [id number] Arrival date & time: 08/24/20  0846      History   Chief Complaint Chief Complaint  Patient presents with   Cough   Rash    HPI Darren Perry is a 45 y.o. male.   45 year old male who presents for evaluation of several issues.  Normally goes to [facility] for his ongoing medical needs.  The first issue is a pruritic rash in the genital area.  He has been using some over-the-counter medication but is not really helping.  He does report that he does sweat in that area quite a lot.  No fever shakes chills.  He has noticed any abscesses.  Nothing draining.  The second issue is a cough that seems to be worse at night.  He has some nasal congestion as well.  He denies any history of GERD, although he does not know if he may be refluxing.  His symptoms seem to be worse at night.  He had his gallbladder out recently.  Again no fever shakes chills.  No nausea vomiting diarrhea.  He has no other URI symptoms.  No chest pain shortness of breath sore throat ear pain.  No red flag signs or symptoms elicited on history.    Past Medical History:  Diagnosis Date   Depression    Hypertension    not at all    Patient Active Problem List   Diagnosis Date Noted   Depression 11/02/2016   Hypertension 11/02/2016   Obesity (BMI 30.0-34.9) 06/15/2016    Past Surgical History:  Procedure Laterality Date   CHOLECYSTECTOMY     CORNEAL TRANSPLANT Bilateral 1990   KNEE ARTHROSCOPY Left 1992   plantar fasciitis Bilateral        Home Medications    Prior to Admission medications   Medication Sig Start Date End Date Taking? Authorizing Provider  clotrimazole (LOTRIMIN) 1 % cream Apply to affected area 2 times daily 08/24/20  Yes Delton See, MD  fluconazole (DIFLUCAN) 150 MG tablet Take 1 tablet (150 mg total) by mouth once a week. Once a week for 6 weeks 08/24/20  Yes Delton See, MD  fluticasone [facility]) 50 MCG/ACT nasal spray Place  2 sprays into both nostrils daily. 08/24/20  Yes Delton See, MD  promethazine-dextromethorphan (PROMETHAZINE-DM) 6.25-15 MG/5ML syrup Take 5 mLs by mouth 4 (four) times daily as needed for cough. 08/24/20  Yes Delton See, MD  ibuprofen (ADVIL) 800 MG tablet Take 1 tablet (800 mg total) by mouth every 8 (eight) hours as needed. 06/09/20   Duanne Guess, MD    Family History Family History  Problem Relation Age of Onset   Hypertension Mother    Diabetes Mother    Cancer Mother    Hypertension Father     Social History Social History   Tobacco Use   Smoking status: Never   Smokeless tobacco: Never  Vaping Use   Vaping Use: Never used  Substance Use Topics   Alcohol use: No   Drug use: No     Allergies   Patient has no known allergies.   Review of Systems Review of Systems  Constitutional:  Negative for activity change, appetite change, chills, diaphoresis, fatigue and fever.  HENT:  Negative for congestion, ear pain, postnasal drip, rhinorrhea, sinus pressure, sinus pain, sneezing and sore throat.   Eyes:  Negative for pain.  Respiratory:  Negative for cough, chest tightness, shortness of breath and wheezing.   Cardiovascular:  Negative  for chest pain and palpitations.  Gastrointestinal:  Negative for abdominal pain, diarrhea, nausea and vomiting.  Genitourinary:  Negative for decreased urine volume, dysuria, flank pain, frequency, penile discharge, penile pain, penile swelling, scrotal swelling and testicular pain.  Musculoskeletal:  Negative for back pain, myalgias and neck pain.  Skin:  Positive for color change and rash. Negative for pallor and wound.  Neurological:  Negative for dizziness, light-headedness and headaches.  All other systems reviewed and are negative.   Physical Exam Triage Vital Signs ED Triage Vitals  Enc Vitals Group     BP 08/24/20 0854 (!) 136/91     Pulse Rate 08/24/20 0854 93     Resp 08/24/20 0854 16     Temp 08/24/20 0854 98.6 F  (37 C)     Temp Source 08/24/20 0854 Oral     SpO2 08/24/20 0854 100 %     Weight 08/24/20 0852 225 lb (102.1 kg)     Height 08/24/20 0852 5\' 8"  (1.727 m)     Head Circumference --      Peak Flow --      Pain Score 08/24/20 0852 0     Pain Loc --      Pain Edu? --      Excl. in GC? --    No data found.  Updated Vital Signs BP (!) 136/91 (BP Location: Left Arm)   Pulse 93   Temp 98.6 F (37 C) (Oral)   Resp 16   Ht 5\' 8"  (1.727 m)   Wt 102.1 kg   SpO2 100%   BMI 34.21 kg/m   Visual Acuity Right Eye Distance:   Left Eye Distance:   Bilateral Distance:    Right Eye Near:   Left Eye Near:    Bilateral Near:     Physical Exam Vitals and nursing note reviewed.  Constitutional:      General: He is not in acute distress.    Appearance: Normal appearance. He is not ill-appearing, toxic-appearing or diaphoretic.  HENT:     Head: Normocephalic and atraumatic.     Nose: Nose normal.     Mouth/Throat:     Mouth: Mucous membranes are moist.  Eyes:     General: No scleral icterus.    Extraocular Movements: Extraocular movements intact.     Conjunctiva/sclera: Conjunctivae normal.     Pupils: Pupils are equal, round, and reactive to light.  Cardiovascular:     Rate and Rhythm: Normal rate and regular rhythm.     Pulses: Normal pulses.     Heart sounds: Normal heart sounds. No murmur heard.   No friction rub. No gallop.  Pulmonary:     Effort: Pulmonary effort is normal.     Breath sounds: Normal breath sounds. No stridor. No wheezing, rhonchi or rales.  Abdominal:     General: There is no distension.     Palpations: Abdomen is soft.     Tenderness: There is no right CVA tenderness, left CVA tenderness, guarding or rebound.  Musculoskeletal:     Cervical back: Normal range of motion and neck supple.  Skin:    General: Skin is warm and dry.     Capillary Refill: Capillary refill takes less than 2 seconds.     Coloration: Skin is not jaundiced.     Findings: Erythema  and rash present.  Neurological:     General: No focal deficit present.     Mental Status: He is alert and oriented to person,  place, and time.     UC Treatments / Results  Labs (all labs ordered are listed, but only abnormal results are displayed) Labs Reviewed - No data to display  EKG   Radiology No results found.  Procedures Procedures (including critical care time)  Medications Ordered in UC Medications - No data to display  Initial Impression / Assessment and Plan / UC Course  I have reviewed the triage vital signs and the nursing notes.  Pertinent labs & imaging results that were available during my care of the patient were reviewed by me and considered in my medical decision making (see chart for details).  Clinical impression: 1.  Rash in the genital area 2.  Tinea cruris 3.  Persistent cough worse at night 4.  Chest congestion and nasal congestion  Treatment plan: 1.  The findings and treatment plan were discussed in detail with the patient.  Patient was in agreement. 2.  For the rash in the genital area unguinal go ahead and treat him for tinea cruris.  Did give him a topical cream as well as an oral Diflucan.  Asked him to take it as directed. 3.  For his cough I went ahead and prescribed a cough medicine as well as a nasal spray.  He may be micro refluxing so I have also advised him to purchase some Prilosec OTC and take it for a month and see if that helps. 4.  Educational handouts provided. 5.  If symptoms persist he should see his primary care provider. 6.  If symptoms worsen advised him to go to the ER. 7.  Over-the-counter meds as needed with symptomatic and supportive care. 8.  Patient was stable upon discharge and will follow-up here as needed.    Final Clinical Impressions(s) / UC Diagnoses   Final diagnoses:  Rash  Cough  Chest congestion  Nasal congestion  Tinea cruris     Discharge Instructions      As we discussed, I am treating  with a topical lotion for your rash as well as an oral medication.  Please take as prescribed. I prescribed a medication for your cough. I also prescribed a medication for your nasal congestion. Please consider purchasing Prilosec OTC and take it for a month in case you are micro refluxing at night when your cough seems to be worse. Please see educational handouts. If symptoms worsen please see her primary care physician.      ED Prescriptions     Medication Sig Dispense Auth. Provider   clotrimazole (LOTRIMIN) 1 % cream Apply to affected area 2 times daily 60 g Delton See, MD   fluconazole (DIFLUCAN) 150 MG tablet Take 1 tablet (150 mg total) by mouth once a week. Once a week for 6 weeks 6 tablet Delton See, MD   fluticasone [facility]) 50 MCG/ACT nasal spray Place 2 sprays into both nostrils daily. 15.8 mL Delton See, MD   promethazine-dextromethorphan (PROMETHAZINE-DM) 6.25-15 MG/5ML syrup Take 5 mLs by mouth 4 (four) times daily as needed for cough. 180 mL Delton See, MD      PDMP not reviewed this encounter.   Delton See, MD 08/24/20 [phone]

## 2020-09-20 ENCOUNTER — Other Ambulatory Visit
Admission: RE | Admit: 2020-09-20 | Discharge: 2020-09-20 | Disposition: A | Discharge status: Home / Self Care | Payer: BC Managed Care – PPO | Source: Ambulatory Visit | Attending: Gastroenterology | Admitting: Gastroenterology

## 2020-09-20 DIAGNOSIS — R194 Change in bowel habit: Secondary | ICD-10-CM | POA: Diagnosis present

## 2020-09-20 DIAGNOSIS — R1013 Epigastric pain: Secondary | ICD-10-CM | POA: Diagnosis present

## 2020-09-20 DIAGNOSIS — R1031 Right lower quadrant pain: Secondary | ICD-10-CM | POA: Diagnosis present

## 2020-09-20 LAB — GASTROINTESTINAL PANEL BY PCR, STOOL (REPLACES STOOL CULTURE)

## 2020-09-23 LAB — CALPROTECTIN, FECAL: Calprotectin, Fecal: 276 ug/g — ABNORMAL HIGH (ref 0–120)

## 2020-10-02 IMAGING — MR MRI OF THE LEFT ANKLE WITHOUT CONTRAST
5 series · 40 of 40 positions shown · non-contrast
Comparison: None.

CLINICAL DATA: Left ankle pain medially.

EXAM:
MRI OF THE LEFT ANKLE WITHOUT CONTRAST
TECHNIQUE: Multiplanar, multisequence MR imaging of the ankle was performed. No
intravenous contrast was administered.

[Series 4: PD fat-sat · axial · left · 3.0mm · 0.50mm/px · z∈[-55,+85]mm · 10 of 36 slices shown]
[im 1/36]
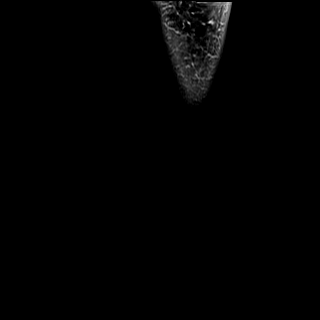
[im 4/36]
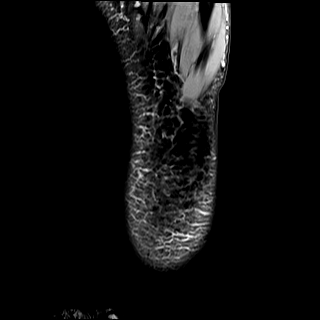
[im 8/36]
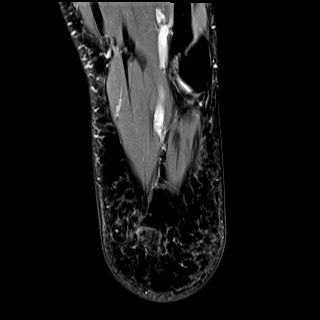
[im 12/36]
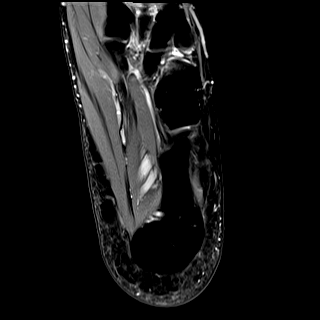
[im 16/36]
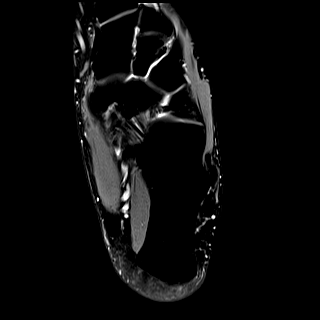
[im 20/36]
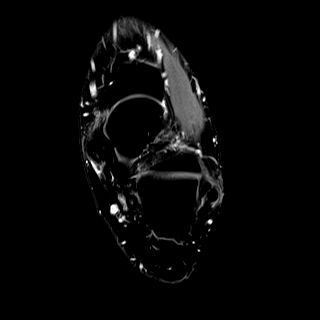
[im 24/36]
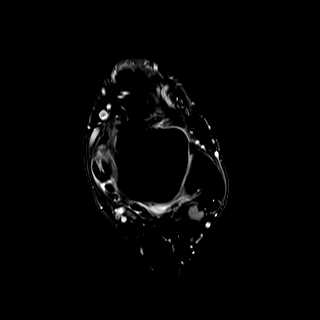
[im 28/36]
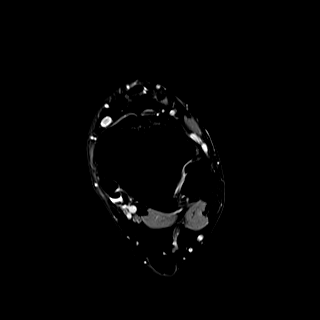
[im 32/36]
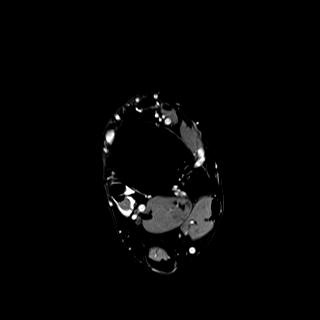
[im 36/36]
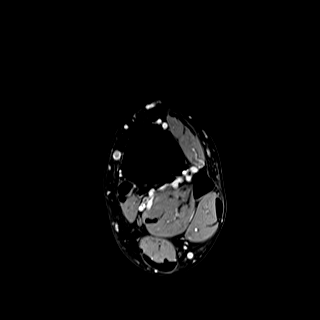

[Series 5: T2 fat-sat · axial · left · 3.0mm · 0.50mm/px · z∈[-55,+85]mm · 10 of 36 slices shown (1 of 2)]
[im 1/36]
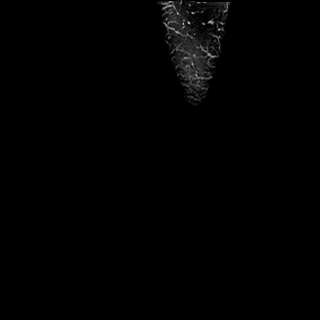
[im 4/36]
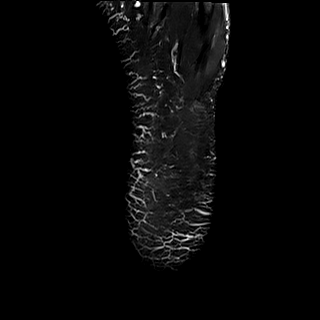
[im 8/36]
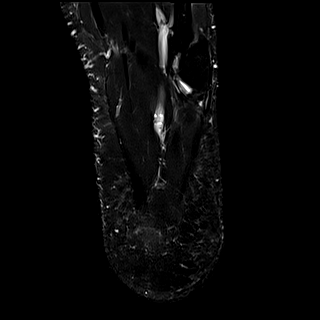
[im 12/36]
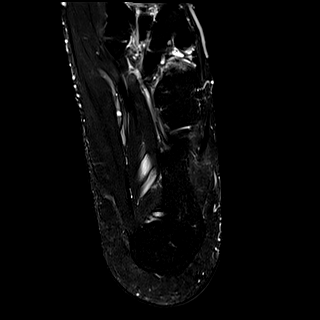
[im 16/36]
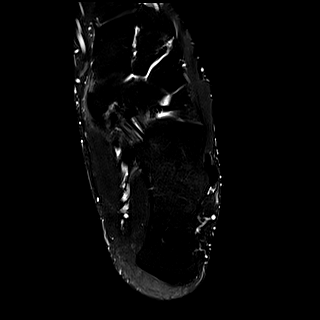
[im 20/36]
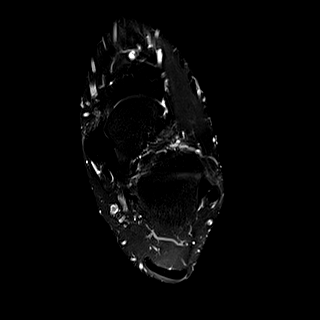
[im 24/36]
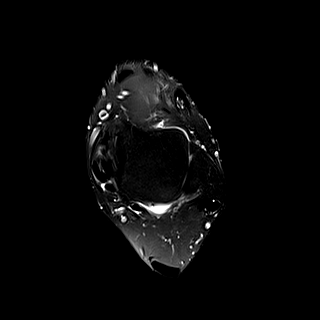
[im 28/36]
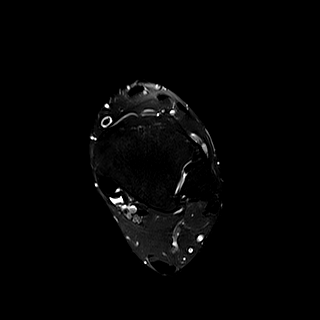
[im 32/36]
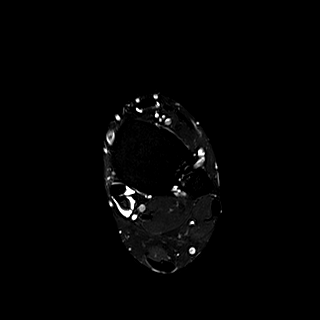
[im 36/36]
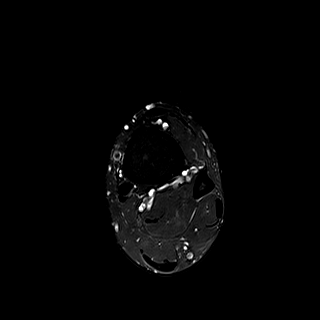

[Series 6: T2 fat-sat · coronal · left · 3.0mm · 0.62mm/px · 10 of 40 slices shown (2 of 2)]
[im 1/40]
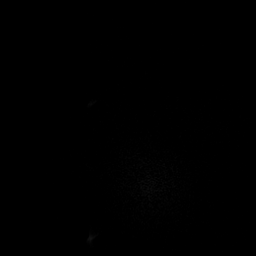
[im 5/40]
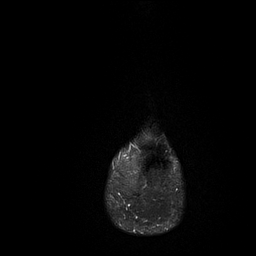
[im 9/40]
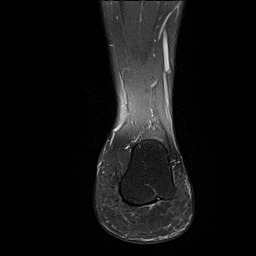
[im 14/40]
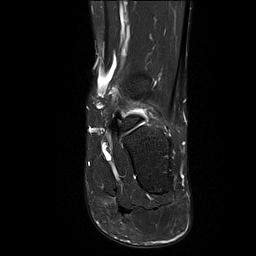
[im 18/40]
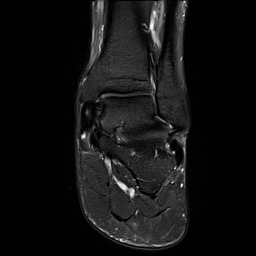
[im 22/40]
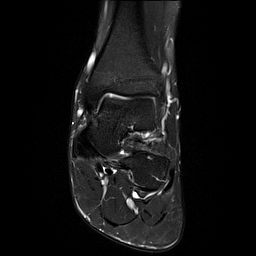
[im 27/40]
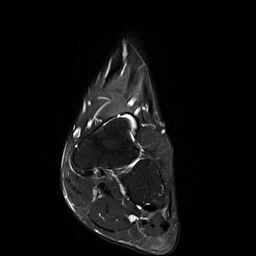
[im 31/40]
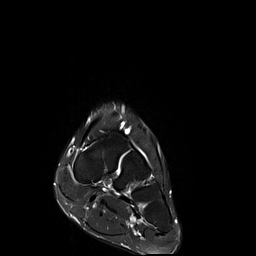
[im 35/40]
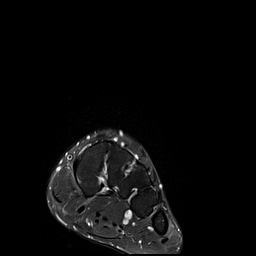
[im 40/40]
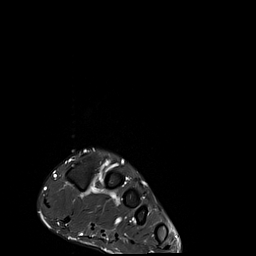

[Series 7: T1 · sagittal · left · 4.0mm · 0.70mm/px · 5 of 21 slices shown]
[im 1/21]
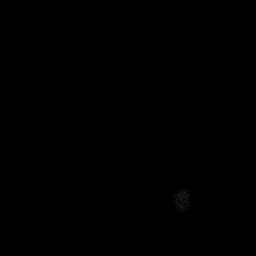
[im 6/21]
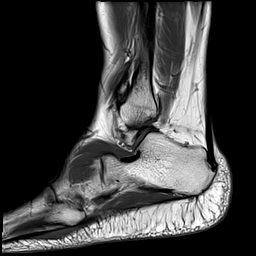
[im 11/21]
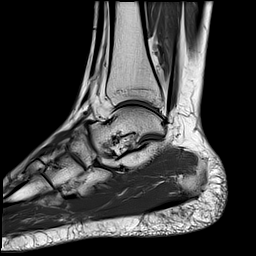
[im 16/21]
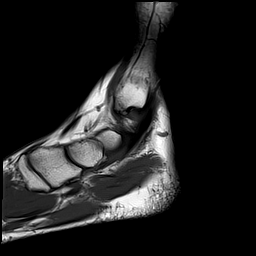
[im 21/21]
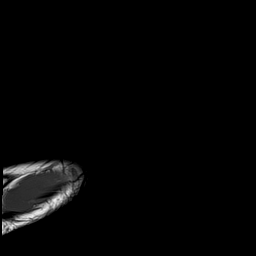

[Series 8: STIR · sagittal · left · 4.0mm · 0.35mm/px · 5 of 21 slices shown]
[im 1/21]
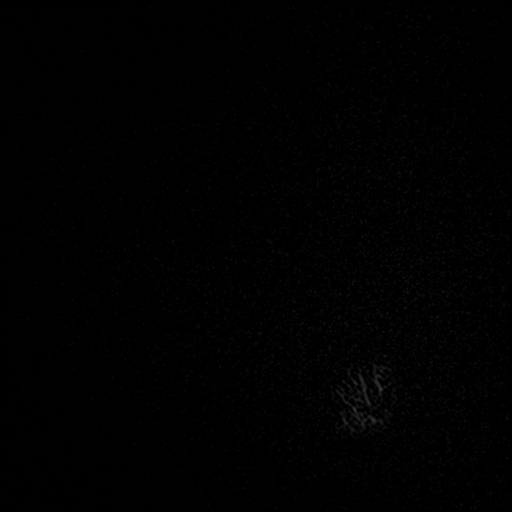
[im 6/21]
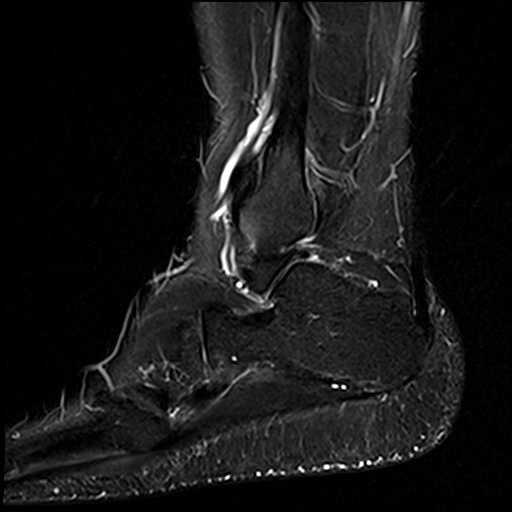
[im 11/21]
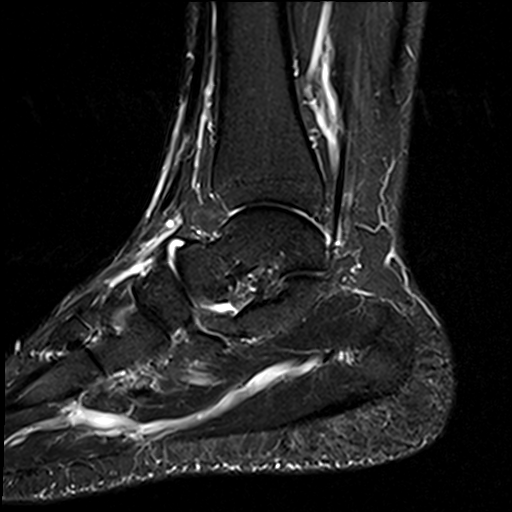
[im 16/21]
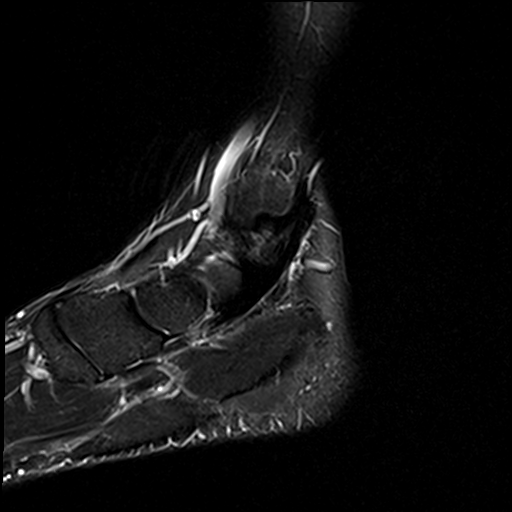
[im 21/21]
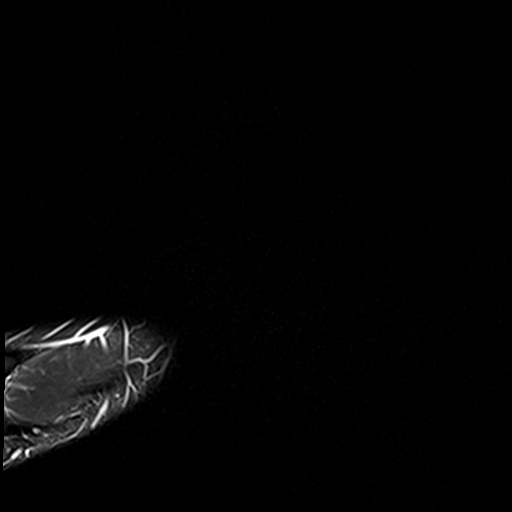

[40 of 40 positions shown; findings below may reference images not displayed]

FINDINGS: TENDONS

Peroneal: Peroneal longus tendon intact. Peroneal brevis intact.

Posteromedial: Moderate tendinosis of the posterior tibial tendon
with a longitudinal split tear. Flexor hallucis longus tendon
intact. Flexor digitorum longus tendon intact.

Anterior: Tibialis anterior tendon intact. Extensor hallucis longus
tendon intact Extensor digitorum longus tendon intact.

Achilles:  Intact.

Plantar Fascia: Intact.

LIGAMENTS

Lateral: Anterior talofibular ligament intact. Calcaneofibular
ligament intact. Posterior talofibular ligament intact. Anterior and
posterior tibiofibular ligaments intact.

Medial: Deltoid ligament intact. Spring ligament intact.

CARTILAGE

Ankle Joint: No joint effusion. Normal ankle mortise. No chondral
defect.

Subtalar Joints/Sinus Tarsi: Normal subtalar joints. No subtalar
joint effusion. Normal sinus tarsi.

Bones: No marrow signal abnormality.  No fracture or dislocation.

Soft Tissue: No soft tissue mass, fluid collection or hematoma.
Muscles are normal.
IMPRESSION: 1. Moderate tendinosis of the posterior tibial tendon with a
longitudinal split tear.

## 2020-10-17 ENCOUNTER — Encounter: Payer: Self-pay | Admitting: General Surgery

## 2021-12-22 ENCOUNTER — Ambulatory Visit (INDEPENDENT_AMBULATORY_CARE_PROVIDER_SITE_OTHER): Payer: BC Managed Care – PPO

## 2021-12-22 ENCOUNTER — Ambulatory Visit (INDEPENDENT_AMBULATORY_CARE_PROVIDER_SITE_OTHER): Payer: BC Managed Care – PPO | Admitting: Podiatry

## 2021-12-22 ENCOUNTER — Encounter: Payer: Self-pay | Admitting: Podiatry

## 2021-12-22 VITALS — BP 136/95 | HR 94

## 2021-12-22 DIAGNOSIS — M7741 Metatarsalgia, right foot: Secondary | ICD-10-CM

## 2021-12-22 DIAGNOSIS — M7751 Other enthesopathy of right foot: Secondary | ICD-10-CM | POA: Diagnosis not present

## 2021-12-22 DIAGNOSIS — R52 Pain, unspecified: Secondary | ICD-10-CM

## 2021-12-22 MED ORDER — BETAMETHASONE SOD PHOS & ACET 6 (3-3) MG/ML IJ SUSP
3.0000 mg | Freq: Once | INTRAMUSCULAR | Status: AC
  Administered 2021-12-22: 3 mg via INTRA_ARTICULAR

## 2021-12-22 MED ORDER — IBUPROFEN 800 MG PO TABS: 800.0000 mg | ORAL_TABLET | Freq: Three times a day (TID) | ORAL | 1 refills | Status: DC

## 2021-12-22 NOTE — Progress Notes (Signed)
   Chief Complaint  Patient presents with   Foot Pain    "My foot hurts below my toes on my right foot." N - pain foot L - 2nd metatarsal rt D - 1 week O - suddenly C - sharp pain A - shoes and walking T - wear flip flops around the house    HPI: 46 y.o. male presenting today for new complaint of pain and tenderness associated to the right forefoot has been going on for about 1 week now.  Patient states that he stepped out of the shower and had pain and tenderness associated to the second toe joint of the right foot.  Denies falling or history of injury.  Past Medical History:  Diagnosis Date   Depression    Hypertension    not at all    Past Surgical History:  Procedure Laterality Date   CHOLECYSTECTOMY     CORNEAL TRANSPLANT Bilateral 1990   KNEE ARTHROSCOPY Left 1992   plantar fasciitis Bilateral     No Known Allergies   Physical Exam: General: The patient is alert and oriented x3 in no acute distress.  Dermatology: Skin is warm, dry and supple bilateral lower extremities. Negative for open lesions or macerations.  Vascular: Palpable pedal pulses bilaterally. Capillary refill within normal limits.  Negative for any significant edema or erythema  Neurological: Light touch and protective threshold grossly intact  Musculoskeletal Exam: No pedal deformities noted.  There is some pain on palpation and range of motion sec MTP of the right foot  Radiographic Exam RT foot 12/22/2021:  Normal osseous mineralization. Joint spaces preserved. No fracture/dislocation/boney destruction.    Assessment: 1.  Second MTP capsulitis right   Plan of Care:  1. Patient evaluated. X-Rays reviewed.  2.  Injection of 0.5 cc Celestone Soluspan injection second to the right 3.  Prescription for Motrin 800 mg 3 times daily as needed 4.  Continue wearing good supportive shoes and sneakers.  Advised against going barefoot 5.  Return to clinic as needed      Felecia Shelling, DPM [facility]  Dr. Felecia Shelling, DPM    [address] [place], [place] [address]                Office [phone]  Fax [phone]

## 2022-02-22 ENCOUNTER — Other Ambulatory Visit: Payer: Self-pay | Admitting: Podiatry

## 2022-03-25 ENCOUNTER — Ambulatory Visit (INDEPENDENT_AMBULATORY_CARE_PROVIDER_SITE_OTHER): Payer: BC Managed Care – PPO

## 2022-03-25 DIAGNOSIS — Z09 Encounter for follow-up examination after completed treatment for conditions other than malignant neoplasm: Secondary | ICD-10-CM | POA: Diagnosis present

## 2022-03-25 DIAGNOSIS — K640 First degree hemorrhoids: Secondary | ICD-10-CM | POA: Diagnosis not present

## 2022-03-25 DIAGNOSIS — D3A8 Other benign neuroendocrine tumors: Secondary | ICD-10-CM | POA: Diagnosis not present

## 2022-03-25 DIAGNOSIS — D124 Benign neoplasm of descending colon: Secondary | ICD-10-CM | POA: Diagnosis not present

## 2022-06-17 ENCOUNTER — Inpatient Hospital Stay: Payer: BC Managed Care – PPO | Attending: Oncology | Admitting: Oncology

## 2022-06-17 ENCOUNTER — Inpatient Hospital Stay: Payer: BC Managed Care – PPO

## 2022-06-17 ENCOUNTER — Encounter: Payer: Self-pay | Admitting: Oncology

## 2022-06-17 VITALS — BP 123/81 | HR 87 | Temp 98.0°F | Resp 18 | Wt 230.3 lb

## 2022-06-17 DIAGNOSIS — Z809 Family history of malignant neoplasm, unspecified: Secondary | ICD-10-CM | POA: Insufficient documentation

## 2022-06-17 DIAGNOSIS — E538 Deficiency of other specified B group vitamins: Secondary | ICD-10-CM | POA: Insufficient documentation

## 2022-06-17 DIAGNOSIS — D539 Nutritional anemia, unspecified: Secondary | ICD-10-CM | POA: Diagnosis present

## 2022-06-17 DIAGNOSIS — Z806 Family history of leukemia: Secondary | ICD-10-CM | POA: Insufficient documentation

## 2022-06-17 DIAGNOSIS — Z800 Family history of malignant neoplasm of digestive organs: Secondary | ICD-10-CM | POA: Diagnosis not present

## 2022-06-17 NOTE — Assessment & Plan Note (Signed)
Recommend genetic testing.  Patient declined.

## 2022-06-17 NOTE — Assessment & Plan Note (Addendum)
Macrocytic anemia, acute on chronic This could be explained by vitamin B12 deficiency and has started on B12 injections.  GI appointment. Recommend to check anti-intrinsic antibody send antibiotic antibodies. Target cells, teardrop cells can be seen in patients with B12 deficiency. We discussed the option of repeating some additional workup today versus wait until after B12 injections.  Shared the position was made to obtain additional workup after 4 weeks. Check CBC, reticulocyte panel, LDH, haptoglobin, smear Further management pending above workup.

## 2022-06-17 NOTE — Progress Notes (Signed)
Hematology/Oncology Consult note [phone] [phone]      Patient Care Team: Dione Housekeeper, MD as PCP - General (Family Medicine)   REFERRING PROVIDER: Freda Munro*  CHIEF COMPLAINTS/REASON FOR VISIT:  Macrocytic Anemia  ASSESSMENT & PLAN:  Macrocytic anemia Macrocytic anemia, acute on chronic This could be explained by vitamin B12 deficiency and has started on B12 injections.  GI appointment. Recommend to check anti-intrinsic antibody send antibiotic antibodies. Target cells, teardrop cells can be seen in patients with B12 deficiency. We discussed the option of repeating some additional workup today versus wait until after B12 injections.  Shared the position was made to obtain additional workup after 4 weeks. Check CBC, reticulocyte panel, LDH, haptoglobin, smear Further management pending above workup.  Family history of cancer Recommend genetic testing.  Patient declined.  Orders Placed This Encounter  Procedures   CBC with Differential/Platelet    Standing Status:   Future    Standing Expiration Date:   06/17/2023   Retic Panel    Standing Status:   Future    Standing Expiration Date:   06/17/2023   Lactate dehydrogenase    Standing Status:   Future    Standing Expiration Date:   06/17/2023   Haptoglobin    Standing Status:   Future    Standing Expiration Date:   06/17/2023   Technologist smear review    Standing Status:   Future    Standing Expiration Date:   06/17/2023    Order Specific Question:   Clinical information:    Answer:   please comment if you see any tear drop cells. thanks.   Intrinsic Factor Antibodies    Standing Status:   Future    Standing Expiration Date:   06/17/2023   Anti-parietal antibody    Standing Status:   Future    Standing Expiration Date:   06/17/2023  Follow-up To be determined. All questions were answered. The patient knows to call the clinic with any problems, questions or  concerns.  Rickard Patience, MD, PhD [facility] Hematology Oncology 06/17/2022     HISTORY OF PRESENTING ILLNESS:  Darren Perry is a  47 y.o.  male with PMH listed below who was referred to me for anemia Reviewed patient's recent labs that was done.  He was found to have abnormal CBC on 06/08/2022 with hemoglobin of 10.8, mcv 110.8, platelet 376,000, wbc 4.8.  Increased lymphocyte% 50.9, decreased monocyte% 3.5, there were tear drop and target cells.  B12 <50, he has started on B12 injections via GI clinic.  Reviewed patient's previous labs ordered by primary care physician's office, anemia is chronic onset , duration is since 2018, hemoglobin gradually decreases over the past few years,.  No aggravating or improving factors.  He denies recent chest pain on exertion, shortness of breath on minimal exertion, pre-syncopal episodes, or palpitations he has had "stomach issue"/dyspepsia, irregular loose stool, currently on PPI. He has lost weight.  Scheduled to get EGD.  He had not noticed any recent bleeding such as epistaxis, hematuria or hematochezia.  He denies over the counter NSAID ingestion. He is not on antiplatelets agents. His last colonoscopy was 04/01/2022 small tubular adenoma in descending colon. Resected.    MEDICAL HISTORY:  Past Medical History:  Diagnosis Date   Depression    Hypertension    not at all    SURGICAL HISTORY: Past Surgical History:  Procedure Laterality Date   CHOLECYSTECTOMY     CORNEAL TRANSPLANT Bilateral 1990  KNEE ARTHROSCOPY Left 1992   plantar fasciitis Bilateral     SOCIAL HISTORY: Social History   Socioeconomic History   Marital status: Married    Spouse name: natasha   Number of children: Not on file   Years of education: Not on file   Highest education level: Not on file  Occupational History   Occupation: Doctor, hospital  Tobacco Use   Smoking status: Never   Smokeless tobacco: Never  Vaping Use   Vaping Use: Never used   Substance and Sexual Activity   Alcohol use: No   Drug use: No   Sexual activity: Yes  Other Topics Concern   Not on file  Social History Narrative   Patient lives with wife. Feels safe in his home.   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   Social Determinants of Health   Financial Resource Strain: Not on file  Food Insecurity: Not on file  Transportation Needs: Not on file  Physical Activity: Not on file  Stress: Not on file  Social Connections: Not on file  Intimate Partner Violence: Not on file    FAMILY HISTORY: Family History  Problem Relation Age of Onset   Cancer Mother 27 - 52       colon cancer   Hypertension Mother    Diabetes Mother    Colon cancer Mother    Hypertension Father    Prostate cancer Maternal Aunt    Leukemia Cousin     ALLERGIES:  has No Known Allergies.  MEDICATIONS:  Current Outpatient Medications  Medication Sig Dispense Refill   famotidine (PEPCID) 20 MG tablet Take 1 tablet by mouth 2 (two) times daily.     fluticasone (FLONASE) 50 MCG/ACT nasal spray Place 2 sprays into both nostrils daily. (Patient not taking: Reported on 12/22/2021) 15.8 mL 0   ibuprofen (ADVIL) 800 MG tablet TAKE 1 TABLET BY MOUTH THREE TIMES A DAY (Patient not taking: Reported on 06/17/2022) 90 tablet 1   No current facility-administered medications for this visit.    Review of Systems  Constitutional:  Positive for appetite change, fatigue and unexpected weight change. Negative for chills and fever.  HENT:   Negative for hearing loss and voice change.   Eyes:  Negative for eye problems and icterus.  Respiratory:  Negative for chest tightness, cough and shortness of breath.   Cardiovascular:  Negative for chest pain and leg swelling.  Gastrointestinal:  Positive for abdominal pain. Negative for abdominal distention.  Endocrine: Negative for hot flashes.  Genitourinary:  Negative for difficulty urinating, dysuria and frequency.   Musculoskeletal:  Negative for  arthralgias.  Skin:  Negative for itching and rash.  Neurological:  Negative for light-headedness and numbness.  Hematological:  Negative for adenopathy. Does not bruise/bleed easily.  Psychiatric/Behavioral:  Negative for confusion.     PHYSICAL EXAMINATION: Vitals:   06/17/22 1524  BP: 123/81  Pulse: 87  Resp: 18  Temp: 98 F (36.7 C)   Filed Weights   06/17/22 1524  Weight: 230 lb 4.8 oz (104.5 kg)    Physical Exam Constitutional:      General: He is not in acute distress. HENT:     Head: Normocephalic and atraumatic.  Eyes:     General: No scleral icterus. Cardiovascular:     Rate and Rhythm: Normal rate and regular rhythm.     Heart sounds: Normal heart sounds.  Pulmonary:     Effort: Pulmonary effort is normal. No respiratory distress.     Breath sounds:  No wheezing.  Abdominal:     General: Bowel sounds are normal. There is no distension.     Palpations: Abdomen is soft.  Musculoskeletal:        General: No deformity. Normal range of motion.     Cervical back: Normal range of motion and neck supple.  Skin:    General: Skin is warm and dry.     Findings: No erythema or rash.  Neurological:     Mental Status: He is alert and oriented to person, place, and time. Mental status is at baseline.     Cranial Nerves: No cranial nerve deficit.     Coordination: Coordination normal.  Psychiatric:        Mood and Affect: Mood normal.      LABORATORY DATA:  I have reviewed the data as listed    Latest Ref Rng & Units 06/04/2020    1:05 PM 05/10/2014    5:06 PM  CBC  WBC 4.0 - 10.5 K/uL 4.2  5.5   Hemoglobin 13.0 - 17.0 g/dL 16.1  09.6   Hematocrit 39.0 - 52.0 % 34.0  38.7   Platelets 150 - 400 K/uL 333  340       Latest Ref Rng & Units 06/04/2020    1:05 PM 05/10/2014    5:06 PM  CMP  Glucose 70 - 99 mg/dL 045  409   BUN 6 - 20 mg/dL 13  16   Creatinine 8.11 - 1.24 mg/dL 9.14  7.82   Sodium 956 - 145 mmol/L 141  142   Potassium 3.5 - 5.1 mmol/L 4.0  3.9    Chloride 98 - 111 mmol/L 108  108   CO2 22 - 32 mmol/L 23  28   Calcium 8.9 - 10.3 mg/dL 9.4  8.9   Total Protein 6.5 - 8.1 g/dL 7.8    Total Bilirubin 0.3 - 1.2 mg/dL 1.0    Alkaline Phos 38 - 126 U/L 36    AST 15 - 41 U/L 22    ALT 0 - 44 U/L 34         RADIOGRAPHIC STUDIES: I have personally reviewed the radiological images as listed and agreed with the findings in the report. No results found.

## 2022-07-08 ENCOUNTER — Ambulatory Visit: Payer: BC Managed Care – PPO

## 2022-07-08 DIAGNOSIS — K31A19 Gastric intestinal metaplasia without dysplasia, unspecified site: Secondary | ICD-10-CM | POA: Diagnosis not present

## 2022-07-08 DIAGNOSIS — K2950 Unspecified chronic gastritis without bleeding: Secondary | ICD-10-CM | POA: Diagnosis present

## 2022-07-09 ENCOUNTER — Inpatient Hospital Stay: Payer: BC Managed Care – PPO

## 2022-07-13 ENCOUNTER — Inpatient Hospital Stay: Payer: BC Managed Care – PPO | Attending: Oncology

## 2022-07-13 DIAGNOSIS — D539 Nutritional anemia, unspecified: Secondary | ICD-10-CM | POA: Insufficient documentation

## 2022-07-13 DIAGNOSIS — Z809 Family history of malignant neoplasm, unspecified: Secondary | ICD-10-CM | POA: Diagnosis not present

## 2022-07-13 LAB — CBC WITH DIFFERENTIAL/PLATELET
Abs Immature Granulocytes: 0.03 10*3/uL (ref 0.00–0.07)
Basophils Absolute: 0.1 10*3/uL (ref 0.0–0.1)
Basophils Relative: 1 %
Eosinophils Absolute: 0.1 10*3/uL (ref 0.0–0.5)
Eosinophils Relative: 2 %
HCT: 35.9 % — ABNORMAL LOW (ref 39.0–52.0)
Hemoglobin: 12.2 g/dL — ABNORMAL LOW (ref 13.0–17.0)
Immature Granulocytes: 1 %
Lymphocytes Relative: 50 %
Lymphs Abs: 3 10*3/uL (ref 0.7–4.0)
MCH: 34.1 pg — ABNORMAL HIGH (ref 26.0–34.0)
MCHC: 34 g/dL (ref 30.0–36.0)
MCV: 100.3 fL — ABNORMAL HIGH (ref 80.0–100.0)
Monocytes Absolute: 0.6 10*3/uL (ref 0.1–1.0)
Monocytes Relative: 10 %
Neutro Abs: 2.2 10*3/uL (ref 1.7–7.7)
Neutrophils Relative %: 36 %
Platelets: 400 10*3/uL (ref 150–400)
RBC: 3.58 MIL/uL — ABNORMAL LOW (ref 4.22–5.81)
RDW: 15.8 % — ABNORMAL HIGH (ref 11.5–15.5)
WBC: 6 10*3/uL (ref 4.0–10.5)
nRBC: 0 % (ref 0.0–0.2)

## 2022-07-13 LAB — RETIC PANEL
Immature Retic Fract: 8.5 % (ref 2.3–15.9)
RBC.: 3.56 MIL/uL — ABNORMAL LOW (ref 4.22–5.81)
Retic Count, Absolute: 39.5 10*3/uL (ref 19.0–186.0)
Retic Ct Pct: 1.1 % (ref 0.4–3.1)
Reticulocyte Hemoglobin: 33.5 pg (ref >27.9)

## 2022-07-13 LAB — TECHNOLOGIST SMEAR REVIEW: Plt Morphology: NORMAL

## 2022-07-13 LAB — LACTATE DEHYDROGENASE: LDH: 153 U/L (ref 98–192)

## 2022-07-14 LAB — ANTI-PARIETAL ANTIBODY: Parietal Cell Antibody-IgG: 3.4 Units (ref 0.0–20.0)

## 2022-07-14 LAB — INTRINSIC FACTOR ANTIBODIES: Intrinsic Factor: 148.2 AU/mL — ABNORMAL HIGH (ref 0.0–1.1)

## 2022-07-14 LAB — HAPTOGLOBIN: Haptoglobin: 96 mg/dL (ref 23–355)

## 2022-07-19 ENCOUNTER — Telehealth: Payer: Self-pay

## 2022-07-19 DIAGNOSIS — D539 Nutritional anemia, unspecified: Secondary | ICD-10-CM

## 2022-07-19 NOTE — Telephone Encounter (Signed)
-----   Message from Lonn Georgia sent at 07/19/2022  9:58 AM EDT ----- 3 months lab/MD appt made and spoke with pt to confirm date/time   ----- Message ----- From: Rickard Patience, MD Sent: 07/18/2022   4:56 PM EDT To: Coralee Rud, RN; Lonn Georgia  Please let patient know that his blood work has improved, anemia level is better.  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  Please arrange him to see me in 3 months, lab MD same day Please order Cbc, B12.  Thanks.  zy

## 2022-07-19 NOTE — Telephone Encounter (Signed)
Error

## 2022-10-19 ENCOUNTER — Inpatient Hospital Stay: Payer: BC Managed Care – PPO

## 2022-10-19 ENCOUNTER — Inpatient Hospital Stay: Payer: BC Managed Care – PPO | Admitting: Oncology

## 2022-10-26 ENCOUNTER — Ambulatory Visit: Payer: BC Managed Care – PPO

## 2022-10-26 DIAGNOSIS — Z23 Encounter for immunization: Secondary | ICD-10-CM

## 2023-01-27 ENCOUNTER — Other Ambulatory Visit: Payer: Self-pay

## 2023-06-10 ENCOUNTER — Ambulatory Visit (INDEPENDENT_AMBULATORY_CARE_PROVIDER_SITE_OTHER): Admitting: Podiatry

## 2023-06-10 ENCOUNTER — Ambulatory Visit (INDEPENDENT_AMBULATORY_CARE_PROVIDER_SITE_OTHER)

## 2023-06-10 ENCOUNTER — Encounter: Payer: Self-pay | Admitting: Podiatry

## 2023-06-10 VITALS — Ht 68.0 in | Wt 230.3 lb

## 2023-06-10 DIAGNOSIS — M7752 Other enthesopathy of left foot: Secondary | ICD-10-CM

## 2023-06-10 MED ORDER — BETAMETHASONE SOD PHOS & ACET 6 (3-3) MG/ML IJ SUSP
3.0000 mg | Freq: Once | INTRAMUSCULAR | Status: AC
  Administered 2023-06-10: 3 mg via INTRA_ARTICULAR

## 2023-06-10 NOTE — Progress Notes (Signed)
   Chief Complaint  Patient presents with   Toe Pain    Pt is here due to left foot pinky toe pain states pain has been there for a while but has been dealing with it, would like some relief from the pain.      HPI: 48 y.o. male presenting today for evaluation of pain and tenderness associated to the fifth MTP of the left foot.  Idiopathic onset of the last 2 weeks.  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  He has not anything for treatment  Past Medical History:  Diagnosis Date   Depression    Hypertension    not at all    Past Surgical History:  Procedure Laterality Date   CHOLECYSTECTOMY     CORNEAL TRANSPLANT Bilateral 1990   KNEE ARTHROSCOPY Left 1992   plantar fasciitis Bilateral     No Known Allergies   Physical Exam: General: The patient is alert and oriented x3 in no acute distress.  Dermatology: Skin is warm, dry and supple bilateral lower extremities.   Vascular: Palpable pedal pulses bilaterally. Capillary refill within normal limits.  No appreciable edema.  No erythema.  Neurological: Grossly intact via light touch  Musculoskeletal Exam: No pedal deformities noted.  Tenderness with palpation range of motion to the fifth MTP of the left foot  Radiographic Exam LT foot 06/10/2023:  Normal osseous mineralization. Joint spaces preserved.  No fractures or osseous irregularities noted.  Impression: Negative  Assessment/Plan of Care: 1.  Fifth MTP capsulitis left 2. H/o EPF bilateral and PT tendon repair left. DOS: 02/22/2019   -Patient evaluated.  X-rays reviewed -Injection of 0.5 cc Celestone  Soluspan injected in the fifth MTP left foot -Recommend wide fitting shoes and work boots that do not irritate or constrict the toebox area -Advised against going barefoot -Patient has active prescription for Motrin  800 mg.  Continue PRN -Return to clinic as needed   Dot Gazella, DPM [facility]  Dr. Dot Gazella, DPM    [address] [place], [place] [address]                Office [phone]  Fax [phone]
